# Patient Record
Sex: Male | Born: 1975 | Race: White | Hispanic: No | Marital: Single
Health system: Southern US, Community
[De-identification: ages and names within clinical notes are randomized; demographics above are authoritative.]

## PROBLEM LIST (undated history)

## (undated) DIAGNOSIS — F32A Depression, unspecified: Secondary | ICD-10-CM

## (undated) DIAGNOSIS — F1990 Other psychoactive substance use, unspecified, uncomplicated: Secondary | ICD-10-CM

## (undated) DIAGNOSIS — I639 Cerebral infarction, unspecified: Secondary | ICD-10-CM

## (undated) DIAGNOSIS — B1920 Unspecified viral hepatitis C without hepatic coma: Secondary | ICD-10-CM

## (undated) DIAGNOSIS — I2510 Atherosclerotic heart disease of native coronary artery without angina pectoris: Secondary | ICD-10-CM

## (undated) DIAGNOSIS — F329 Major depressive disorder, single episode, unspecified: Secondary | ICD-10-CM

## (undated) HISTORY — PX: HERNIA REPAIR: SHX51

---

## 2003-03-08 ENCOUNTER — Other Ambulatory Visit: Payer: Self-pay

## 2004-08-20 ENCOUNTER — Emergency Department: Payer: Self-pay | Admitting: Unknown Physician Specialty

## 2007-04-02 ENCOUNTER — Emergency Department: Payer: Self-pay | Admitting: Internal Medicine

## 2008-02-08 ENCOUNTER — Emergency Department: Payer: Self-pay | Admitting: Emergency Medicine

## 2008-08-24 ENCOUNTER — Emergency Department: Payer: Self-pay | Admitting: Internal Medicine

## 2009-08-22 ENCOUNTER — Emergency Department: Payer: Self-pay | Admitting: Emergency Medicine

## 2009-09-01 ENCOUNTER — Emergency Department: Payer: Self-pay | Admitting: Emergency Medicine

## 2009-09-06 ENCOUNTER — Emergency Department: Payer: Self-pay | Admitting: Emergency Medicine

## 2010-04-08 ENCOUNTER — Emergency Department: Payer: Self-pay | Admitting: Emergency Medicine

## 2011-01-05 ENCOUNTER — Ambulatory Visit: Payer: Self-pay | Admitting: Pain Medicine

## 2011-03-02 ENCOUNTER — Emergency Department: Payer: Self-pay | Admitting: Emergency Medicine

## 2011-10-24 ENCOUNTER — Ambulatory Visit: Payer: Self-pay | Admitting: Family Medicine

## 2011-12-09 ENCOUNTER — Emergency Department: Payer: Self-pay | Admitting: Emergency Medicine

## 2011-12-09 LAB — ETHANOL
Ethanol %: <0.003 % (ref 0.000–0.080)
Ethanol: <3 mg/dL

## 2011-12-09 LAB — CBC
HCT: 40.6 % (ref 40.0–52.0)
HGB: 14 g/dL (ref 13.0–18.0)
MCV: 90 fL (ref 80–100)
Platelet: 156 10*3/uL (ref 150–440)
RBC: 4.53 10*6/uL (ref 4.40–5.90)
RDW: 13.2 % (ref 11.5–14.5)
WBC: 7.5 10*3/uL (ref 3.8–10.6)

## 2011-12-09 LAB — COMPREHENSIVE METABOLIC PANEL
Albumin: 3.8 g/dL (ref 3.4–5.0)
Alkaline Phosphatase: 83 U/L (ref 50–136)
BUN: 15 mg/dL (ref 7–18)
Bilirubin,Total: 0.3 mg/dL (ref 0.2–1.0)
Calcium, Total: 8.6 mg/dL (ref 8.5–10.1)
Co2: 27 mmol/L (ref 21–32)
Creatinine: 1.01 mg/dL (ref 0.60–1.30)
EGFR (Non-African Amer.): >60
Osmolality: 287 (ref 275–301)
Potassium: 3.8 mmol/L (ref 3.5–5.1)
SGPT (ALT): 18 U/L (ref 12–78)
Sodium: 143 mmol/L (ref 136–145)
Total Protein: 6.6 g/dL (ref 6.4–8.2)

## 2011-12-09 LAB — DRUG SCREEN, URINE
Amphetamines, Ur Screen: NEGATIVE (ref ?–1000)
Benzodiazepine, Ur Scrn: POSITIVE (ref ?–200)
Cannabinoid 50 Ng, Ur ~~LOC~~: POSITIVE (ref ?–50)
Cocaine Metabolite,Ur ~~LOC~~: POSITIVE (ref ?–300)
MDMA (Ecstasy)Ur Screen: NEGATIVE (ref ?–500)
Methadone, Ur Screen: NEGATIVE (ref ?–300)
Tricyclic, Ur Screen: NEGATIVE (ref ?–1000)

## 2011-12-09 LAB — URINALYSIS, COMPLETE
Bacteria: NONE SEEN
Bilirubin,UR: NEGATIVE
Blood: NEGATIVE
Ketone: NEGATIVE
Leukocyte Esterase: NEGATIVE
Ph: 6 (ref 4.5–8.0)
RBC,UR: <1 /HPF (ref 0–5)
Specific Gravity: 1.01 (ref 1.003–1.030)
Squamous Epithelial: NONE SEEN
WBC UR: <1 /HPF (ref 0–5)

## 2011-12-09 LAB — SALICYLATE LEVEL: Salicylates, Serum: 2.7 mg/dL

## 2011-12-09 LAB — ACETAMINOPHEN LEVEL: Acetaminophen: <2 ug/mL

## 2012-03-03 ENCOUNTER — Emergency Department: Payer: Self-pay | Admitting: Emergency Medicine

## 2012-03-03 LAB — COMPREHENSIVE METABOLIC PANEL
Albumin: 3.3 g/dL — ABNORMAL LOW (ref 3.4–5.0)
Alkaline Phosphatase: 117 U/L (ref 50–136)
Anion Gap: 8 (ref 7–16)
Calcium, Total: 8.6 mg/dL (ref 8.5–10.1)
Chloride: 105 mmol/L (ref 98–107)
EGFR (Non-African Amer.): >60
Glucose: 139 mg/dL — ABNORMAL HIGH (ref 65–99)
Osmolality: 282 (ref 275–301)
Potassium: 3.1 mmol/L — ABNORMAL LOW (ref 3.5–5.1)
SGPT (ALT): 26 U/L (ref 12–78)
Sodium: 141 mmol/L (ref 136–145)
Total Protein: 6.9 g/dL (ref 6.4–8.2)

## 2012-03-03 LAB — TROPONIN I: Troponin-I: <0.02 ng/mL

## 2012-03-03 LAB — CBC
HCT: 40.8 % (ref 40.0–52.0)
MCH: 31.1 pg (ref 26.0–34.0)
MCHC: 34.3 g/dL (ref 32.0–36.0)
MCV: 91 fL (ref 80–100)
RDW: 13 % (ref 11.5–14.5)

## 2012-03-03 LAB — CK TOTAL AND CKMB (NOT AT ARMC): CK, Total: 117 U/L (ref 35–232)

## 2012-05-09 ENCOUNTER — Inpatient Hospital Stay: Payer: Self-pay | Admitting: Psychiatry

## 2012-05-09 LAB — URINALYSIS, COMPLETE
Bilirubin,UR: NEGATIVE
Blood: NEGATIVE
Glucose,UR: NEGATIVE mg/dL (ref 0–75)
Ketone: NEGATIVE
Leukocyte Esterase: NEGATIVE
Protein: NEGATIVE
RBC,UR: 2 /HPF (ref 0–5)
Specific Gravity: 1.026 (ref 1.003–1.030)

## 2012-05-09 LAB — CBC
HCT: 50.7 % (ref 40.0–52.0)
HGB: 17.2 g/dL (ref 13.0–18.0)
MCH: 30.2 pg (ref 26.0–34.0)
MCHC: 33.8 g/dL (ref 32.0–36.0)
RDW: 13.1 % (ref 11.5–14.5)
WBC: 14.1 10*3/uL — ABNORMAL HIGH (ref 3.8–10.6)

## 2012-05-09 LAB — DRUG SCREEN, URINE
Amphetamines, Ur Screen: NEGATIVE (ref ?–1000)
Cannabinoid 50 Ng, Ur ~~LOC~~: POSITIVE (ref ?–50)
Cocaine Metabolite,Ur ~~LOC~~: NEGATIVE (ref ?–300)
Methadone, Ur Screen: NEGATIVE (ref ?–300)
Tricyclic, Ur Screen: NEGATIVE (ref ?–1000)

## 2012-05-09 LAB — COMPREHENSIVE METABOLIC PANEL
Albumin: 4.5 g/dL (ref 3.4–5.0)
Calcium, Total: 8.6 mg/dL (ref 8.5–10.1)
Chloride: 112 mmol/L — ABNORMAL HIGH (ref 98–107)
Co2: 28 mmol/L (ref 21–32)
EGFR (African American): >60
SGPT (ALT): 26 U/L (ref 12–78)
Sodium: 142 mmol/L (ref 136–145)
Total Protein: 8.2 g/dL (ref 6.4–8.2)

## 2012-05-09 LAB — ACETAMINOPHEN LEVEL: Acetaminophen: <2 ug/mL

## 2012-05-09 LAB — TSH: Thyroid Stimulating Horm: 3.88 u[IU]/mL

## 2012-05-09 LAB — ETHANOL: Ethanol %: <0.003 % (ref 0.000–0.080)

## 2012-05-10 LAB — BEHAVIORAL MEDICINE 1 PANEL
Albumin: 3.6 g/dL (ref 3.4–5.0)
Alkaline Phosphatase: 83 U/L (ref 50–136)
Anion Gap: 6 — ABNORMAL LOW (ref 7–16)
Basophil #: 0.1 10*3/uL (ref 0.0–0.1)
Basophil %: 0.8 %
Calcium, Total: 8.3 mg/dL — ABNORMAL LOW (ref 8.5–10.1)
Co2: 27 mmol/L (ref 21–32)
Glucose: 87 mg/dL (ref 65–99)
HCT: 45.8 % (ref 40.0–52.0)
Lymphocyte #: 3.4 10*3/uL (ref 1.0–3.6)
MCHC: 31.6 g/dL — ABNORMAL LOW (ref 32.0–36.0)
MCV: 90 fL (ref 80–100)
Monocyte %: 4.9 %
Neutrophil #: 3 10*3/uL (ref 1.4–6.5)
Neutrophil %: 41.9 %
Osmolality: 279 (ref 275–301)
Platelet: 173 10*3/uL (ref 150–440)
RBC: 5.11 10*6/uL (ref 4.40–5.90)
RDW: 13.4 % (ref 11.5–14.5)
Sodium: 140 mmol/L (ref 136–145)
Thyroid Stimulating Horm: 0.378 u[IU]/mL — ABNORMAL LOW

## 2012-05-11 LAB — URINALYSIS, COMPLETE
Bilirubin,UR: NEGATIVE
Leukocyte Esterase: NEGATIVE
Nitrite: NEGATIVE
Protein: NEGATIVE
Specific Gravity: 1.006 (ref 1.003–1.030)
Squamous Epithelial: NONE SEEN

## 2012-09-30 ENCOUNTER — Inpatient Hospital Stay: Payer: Self-pay | Admitting: Internal Medicine

## 2012-09-30 LAB — BASIC METABOLIC PANEL
Anion Gap: 4 — ABNORMAL LOW (ref 7–16)
BUN: 8 mg/dL (ref 7–18)
Co2: 31 mmol/L (ref 21–32)
EGFR (African American): >60
EGFR (Non-African Amer.): >60
Glucose: 131 mg/dL — ABNORMAL HIGH (ref 65–99)
Potassium: 3.8 mmol/L (ref 3.5–5.1)

## 2012-09-30 LAB — CBC WITH DIFFERENTIAL/PLATELET
Basophil #: 0.1 10*3/uL (ref 0.0–0.1)
Basophil %: 0.5 %
Eosinophil #: 0 10*3/uL (ref 0.0–0.7)
HCT: 44.8 % (ref 40.0–52.0)
MCH: 28.7 pg (ref 26.0–34.0)
MCHC: 33.2 g/dL (ref 32.0–36.0)
MCV: 87 fL (ref 80–100)
Monocyte #: 0.9 x10 3/mm (ref 0.2–1.0)
Monocyte %: 7.5 %
Neutrophil %: 75.2 %
RBC: 5.17 10*6/uL (ref 4.40–5.90)
RDW: 13.2 % (ref 11.5–14.5)
WBC: 12.1 10*3/uL — ABNORMAL HIGH (ref 3.8–10.6)

## 2012-10-01 LAB — TSH: Thyroid Stimulating Horm: 0.392 u[IU]/mL — ABNORMAL LOW

## 2012-10-01 LAB — CBC WITH DIFFERENTIAL/PLATELET
Basophil #: 0.1 10*3/uL (ref 0.0–0.1)
HCT: 42.8 % (ref 40.0–52.0)
Lymphocyte #: 2.5 10*3/uL (ref 1.0–3.6)
Lymphocyte %: 20.3 %
MCH: 29.9 pg (ref 26.0–34.0)
MCV: 86 fL (ref 80–100)
Monocyte %: 7.5 %
Neutrophil %: 70.3 %
Platelet: 139 10*3/uL — ABNORMAL LOW (ref 150–440)
RBC: 4.96 10*6/uL (ref 4.40–5.90)
RDW: 13.5 % (ref 11.5–14.5)
WBC: 12.2 10*3/uL — ABNORMAL HIGH (ref 3.8–10.6)

## 2012-10-01 LAB — BASIC METABOLIC PANEL
Anion Gap: 5 — ABNORMAL LOW (ref 7–16)
BUN: 7 mg/dL (ref 7–18)
Chloride: 106 mmol/L (ref 98–107)
Co2: 27 mmol/L (ref 21–32)
Creatinine: 1.21 mg/dL (ref 0.60–1.30)
EGFR (African American): >60
Glucose: 138 mg/dL — ABNORMAL HIGH (ref 65–99)
Osmolality: 276 (ref 275–301)
Potassium: 3.3 mmol/L — ABNORMAL LOW (ref 3.5–5.1)
Sodium: 138 mmol/L (ref 136–145)

## 2012-10-01 LAB — MAGNESIUM: Magnesium: 1.9 mg/dL

## 2012-10-02 LAB — CREATININE, SERUM
Creatinine: 4.88 mg/dL — ABNORMAL HIGH (ref 0.60–1.30)
EGFR (African American): 16 — ABNORMAL LOW

## 2012-10-03 LAB — PROTEIN / CREATININE RATIO, URINE
Creatinine, Urine: 50.3 mg/dL (ref 30.0–125.0)
Protein, Random Urine: 35 mg/dL — ABNORMAL HIGH (ref 0–12)
Protein/Creat. Ratio: 696 mg/gCREAT — ABNORMAL HIGH (ref 0–200)

## 2012-10-03 LAB — CBC WITH DIFFERENTIAL/PLATELET
Basophil #: 0.1 10*3/uL (ref 0.0–0.1)
Eosinophil #: 0.2 10*3/uL (ref 0.0–0.7)
Eosinophil %: 2.6 %
Lymphocyte #: 1.8 10*3/uL (ref 1.0–3.6)
Lymphocyte %: 20.1 %
MCH: 30.3 pg (ref 26.0–34.0)
MCHC: 34.8 g/dL (ref 32.0–36.0)
MCV: 87 fL (ref 80–100)
Monocyte #: 0.8 x10 3/mm (ref 0.2–1.0)
Neutrophil #: 6.2 10*3/uL (ref 1.4–6.5)
WBC: 9.1 10*3/uL (ref 3.8–10.6)

## 2012-10-03 LAB — URINALYSIS, COMPLETE
Glucose,UR: NEGATIVE mg/dL (ref 0–75)
Ketone: NEGATIVE
Nitrite: NEGATIVE
Ph: 5 (ref 4.5–8.0)
RBC,UR: 1 /HPF (ref 0–5)
Specific Gravity: 1.009 (ref 1.003–1.030)
Squamous Epithelial: NONE SEEN

## 2012-10-03 LAB — BASIC METABOLIC PANEL
Anion Gap: 6 — ABNORMAL LOW (ref 7–16)
BUN: 26 mg/dL — ABNORMAL HIGH (ref 7–18)
Chloride: 107 mmol/L (ref 98–107)
Co2: 24 mmol/L (ref 21–32)
Creatinine: 7.21 mg/dL — ABNORMAL HIGH (ref 0.60–1.30)
EGFR (African American): 10 — ABNORMAL LOW
EGFR (Non-African Amer.): 9 — ABNORMAL LOW
Glucose: 121 mg/dL — ABNORMAL HIGH (ref 65–99)
Potassium: 5 mmol/L (ref 3.5–5.1)

## 2012-10-04 LAB — BASIC METABOLIC PANEL
Anion Gap: 7 (ref 7–16)
BUN: 29 mg/dL — ABNORMAL HIGH (ref 7–18)
Calcium, Total: 8 mg/dL — ABNORMAL LOW (ref 8.5–10.1)
Chloride: 110 mmol/L — ABNORMAL HIGH (ref 98–107)
Co2: 22 mmol/L (ref 21–32)
EGFR (African American): 8 — ABNORMAL LOW
EGFR (Non-African Amer.): 7 — ABNORMAL LOW
Glucose: 112 mg/dL — ABNORMAL HIGH (ref 65–99)
Osmolality: 284 (ref 275–301)
Potassium: 4.7 mmol/L (ref 3.5–5.1)
Sodium: 139 mmol/L (ref 136–145)

## 2012-10-05 LAB — COMPREHENSIVE METABOLIC PANEL
Albumin: 1.9 g/dL — ABNORMAL LOW (ref 3.4–5.0)
Alkaline Phosphatase: 157 U/L — ABNORMAL HIGH (ref 50–136)
Anion Gap: 9 (ref 7–16)
BUN: 33 mg/dL — ABNORMAL HIGH (ref 7–18)
Bilirubin,Total: 0.4 mg/dL (ref 0.2–1.0)
Calcium, Total: 7.8 mg/dL — ABNORMAL LOW (ref 8.5–10.1)
Creatinine: 9.16 mg/dL — ABNORMAL HIGH (ref 0.60–1.30)
EGFR (African American): 8 — ABNORMAL LOW
Glucose: 123 mg/dL — ABNORMAL HIGH (ref 65–99)
SGOT(AST): 52 U/L — ABNORMAL HIGH (ref 15–37)
SGPT (ALT): 69 U/L (ref 12–78)
Total Protein: 5.2 g/dL — ABNORMAL LOW (ref 6.4–8.2)

## 2012-10-05 LAB — CBC WITH DIFFERENTIAL/PLATELET
Basophil %: 1 %
Eosinophil #: 0.3 10*3/uL (ref 0.0–0.7)
HCT: 38.6 % — ABNORMAL LOW (ref 40.0–52.0)
HGB: 13.6 g/dL (ref 13.0–18.0)
Lymphocyte #: 2.3 10*3/uL (ref 1.0–3.6)
Lymphocyte %: 28.8 %
MCH: 30.2 pg (ref 26.0–34.0)
MCV: 86 fL (ref 80–100)
Monocyte %: 8.7 %
RBC: 4.49 10*6/uL (ref 4.40–5.90)
RDW: 13.6 % (ref 11.5–14.5)
WBC: 8 10*3/uL (ref 3.8–10.6)

## 2012-10-05 LAB — PHOSPHORUS: Phosphorus: 4.8 mg/dL (ref 2.5–4.9)

## 2012-10-05 LAB — CULTURE, BLOOD (SINGLE)

## 2012-10-06 LAB — BASIC METABOLIC PANEL
Anion Gap: 11 (ref 7–16)
BUN: 35 mg/dL — ABNORMAL HIGH (ref 7–18)
Calcium, Total: 8.1 mg/dL — ABNORMAL LOW (ref 8.5–10.1)
Chloride: 108 mmol/L — ABNORMAL HIGH (ref 98–107)
Co2: 21 mmol/L (ref 21–32)
Creatinine: 9.81 mg/dL — ABNORMAL HIGH (ref 0.60–1.30)
EGFR (African American): 7 — ABNORMAL LOW
Glucose: 99 mg/dL (ref 65–99)
Potassium: 4.8 mmol/L (ref 3.5–5.1)

## 2012-10-07 LAB — BASIC METABOLIC PANEL
BUN: 38 mg/dL — ABNORMAL HIGH (ref 7–18)
Calcium, Total: 8.2 mg/dL — ABNORMAL LOW (ref 8.5–10.1)
Chloride: 109 mmol/L — ABNORMAL HIGH (ref 98–107)
Co2: 22 mmol/L (ref 21–32)
EGFR (African American): 8 — ABNORMAL LOW
Glucose: 121 mg/dL — ABNORMAL HIGH (ref 65–99)
Osmolality: 290 (ref 275–301)
Potassium: 4.8 mmol/L (ref 3.5–5.1)

## 2012-10-07 LAB — CBC WITH DIFFERENTIAL/PLATELET
Basophil #: 0.1 10*3/uL (ref 0.0–0.1)
Eosinophil #: 0.3 10*3/uL (ref 0.0–0.7)
Lymphocyte #: 2.5 10*3/uL (ref 1.0–3.6)
Monocyte #: 0.6 x10 3/mm (ref 0.2–1.0)
Neutrophil #: 6.4 10*3/uL (ref 1.4–6.5)
Neutrophil %: 64.9 %
Platelet: 177 10*3/uL (ref 150–440)
RBC: 4.45 10*6/uL (ref 4.40–5.90)
RDW: 13.7 % (ref 11.5–14.5)

## 2012-10-08 LAB — BASIC METABOLIC PANEL
Anion Gap: 5 — ABNORMAL LOW (ref 7–16)
BUN: 39 mg/dL — ABNORMAL HIGH (ref 7–18)
Glucose: 102 mg/dL — ABNORMAL HIGH (ref 65–99)
Osmolality: 289 (ref 275–301)
Sodium: 140 mmol/L (ref 136–145)

## 2012-10-09 LAB — BASIC METABOLIC PANEL
Anion Gap: 5 — ABNORMAL LOW (ref 7–16)
Chloride: 111 mmol/L — ABNORMAL HIGH (ref 98–107)
Co2: 25 mmol/L (ref 21–32)
Creatinine: 7.46 mg/dL — ABNORMAL HIGH (ref 0.60–1.30)
EGFR (African American): 10 — ABNORMAL LOW
EGFR (Non-African Amer.): 8 — ABNORMAL LOW
Glucose: 104 mg/dL — ABNORMAL HIGH (ref 65–99)

## 2012-10-10 LAB — BASIC METABOLIC PANEL
Calcium, Total: 8.3 mg/dL — ABNORMAL LOW (ref 8.5–10.1)
Chloride: 111 mmol/L — ABNORMAL HIGH (ref 98–107)
Co2: 23 mmol/L (ref 21–32)
Creatinine: 6.05 mg/dL — ABNORMAL HIGH (ref 0.60–1.30)
EGFR (African American): 13 — ABNORMAL LOW
EGFR (Non-African Amer.): 11 — ABNORMAL LOW
Glucose: 95 mg/dL (ref 65–99)
Potassium: 4.8 mmol/L (ref 3.5–5.1)
Sodium: 142 mmol/L (ref 136–145)

## 2012-10-11 LAB — BASIC METABOLIC PANEL
Calcium, Total: 8.6 mg/dL (ref 8.5–10.1)
Chloride: 113 mmol/L — ABNORMAL HIGH (ref 98–107)
Co2: 24 mmol/L (ref 21–32)
EGFR (Non-African Amer.): 14 — ABNORMAL LOW
Osmolality: 291 (ref 275–301)
Potassium: 4.8 mmol/L (ref 3.5–5.1)
Sodium: 142 mmol/L (ref 136–145)

## 2012-10-11 LAB — PLATELET COUNT: Platelet: 148 10*3/uL — ABNORMAL LOW (ref 150–440)

## 2012-10-12 LAB — BASIC METABOLIC PANEL
Anion Gap: 7 (ref 7–16)
BUN: 43 mg/dL — ABNORMAL HIGH (ref 7–18)
Chloride: 108 mmol/L — ABNORMAL HIGH (ref 98–107)
Co2: 24 mmol/L (ref 21–32)
Creatinine: 4.13 mg/dL — ABNORMAL HIGH (ref 0.60–1.30)
EGFR (Non-African Amer.): 17 — ABNORMAL LOW
Glucose: 119 mg/dL — ABNORMAL HIGH (ref 65–99)

## 2013-05-04 ENCOUNTER — Emergency Department: Payer: Self-pay | Admitting: Emergency Medicine

## 2013-05-05 LAB — COMPREHENSIVE METABOLIC PANEL
ALBUMIN: 2.8 g/dL — AB (ref 3.4–5.0)
ANION GAP: 3 — AB (ref 7–16)
Alkaline Phosphatase: 107 U/L
BUN: 9 mg/dL (ref 7–18)
Bilirubin,Total: 0.3 mg/dL (ref 0.2–1.0)
CALCIUM: 7.7 mg/dL — AB (ref 8.5–10.1)
Chloride: 105 mmol/L (ref 98–107)
Co2: 32 mmol/L (ref 21–32)
Creatinine: 1.03 mg/dL (ref 0.60–1.30)
EGFR (African American): >60
EGFR (Non-African Amer.): >60
Glucose: 115 mg/dL — ABNORMAL HIGH (ref 65–99)
Osmolality: 279 (ref 275–301)
POTASSIUM: 3.7 mmol/L (ref 3.5–5.1)
SGOT(AST): 103 U/L — ABNORMAL HIGH (ref 15–37)
SGPT (ALT): 132 U/L — ABNORMAL HIGH (ref 12–78)
Sodium: 140 mmol/L (ref 136–145)
TOTAL PROTEIN: 6.1 g/dL — AB (ref 6.4–8.2)

## 2013-05-05 LAB — CBC
HCT: 40.3 % (ref 40.0–52.0)
HGB: 13.5 g/dL (ref 13.0–18.0)
MCH: 30.2 pg (ref 26.0–34.0)
MCHC: 33.5 g/dL (ref 32.0–36.0)
MCV: 90 fL (ref 80–100)
Platelet: 152 10*3/uL (ref 150–440)
RBC: 4.47 10*6/uL (ref 4.40–5.90)
RDW: 12.8 % (ref 11.5–14.5)
WBC: 6.3 10*3/uL (ref 3.8–10.6)

## 2013-05-05 LAB — PRO B NATRIURETIC PEPTIDE: B-Type Natriuretic Peptide: 91 pg/mL (ref 0–125)

## 2013-10-03 IMAGING — XA IR VASCULAR PROCEDURE
1 series · 1 of 1 positions shown · non-contrast
Comparison: none

[Series 1: single · 1 of 1 slices shown]
[im 1/1]
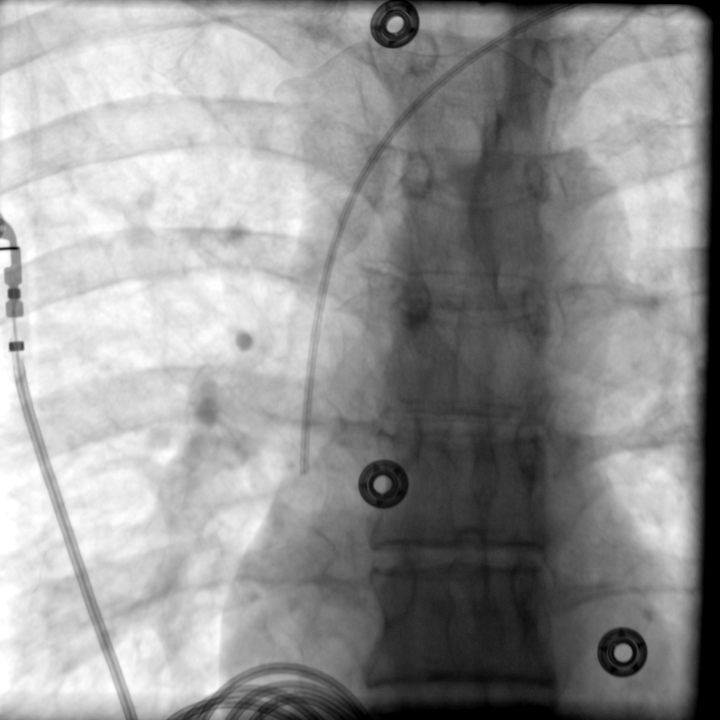

[1 of 1 positions shown; findings below may reference images not displayed]

IMAGES IMPORTED FROM THE [employer] WORKFLOW SYSTEM
NO DICTATION FOR STUDY

## 2013-10-08 IMAGING — CR DG ABDOMEN 3V
1 series · 4 of 4 positions shown · non-contrast
Comparison: none

REASON FOR EXAM: ABDOMINAL PAIN.
COMMENTS:   May transport without cardiac monitor

PROCEDURE:     DXR - DXR ABDOMEN 3-WAY (INCL PA CXR)  - October 06, 2012  [DATE]
RESULT:

[Series 3: w chest pa · 0.14mm/px · 4 of 4 slices shown]
[im 1/4]
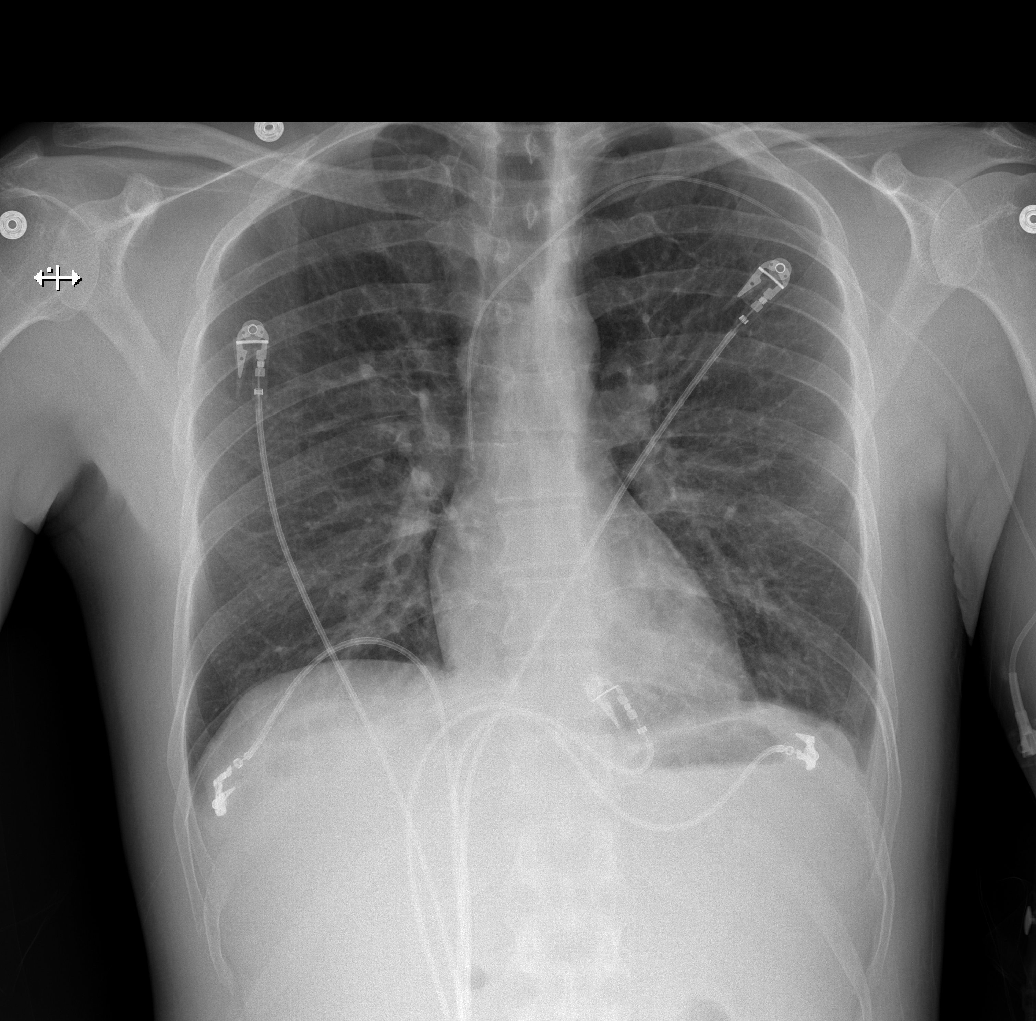
[im 2/4]
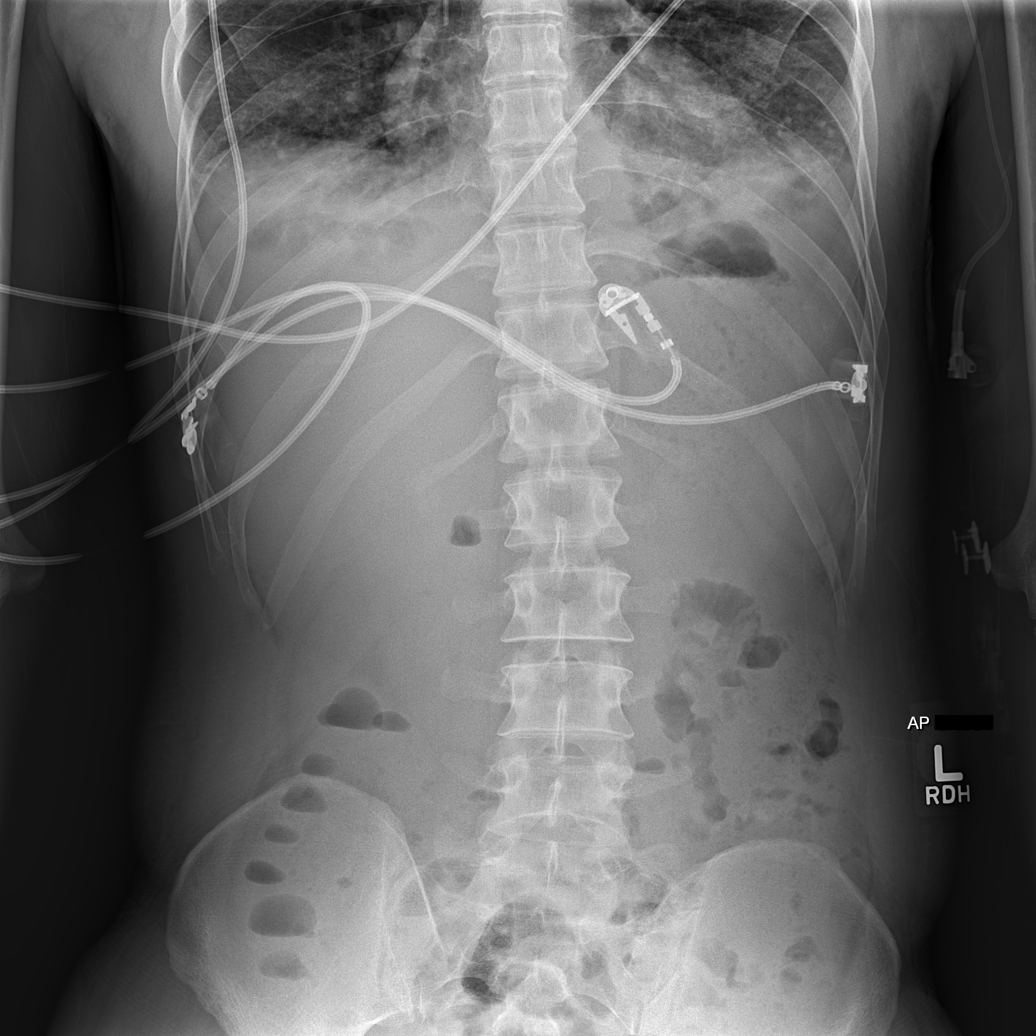
[im 3/4]
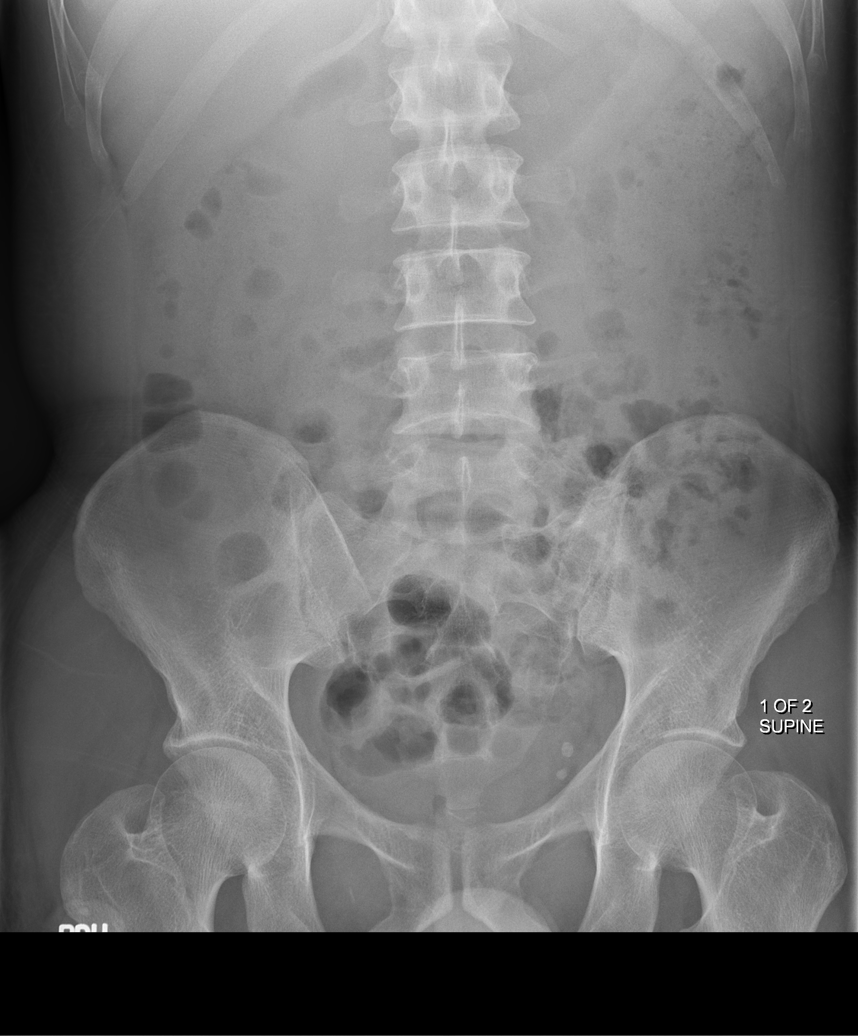
[im 4/4]
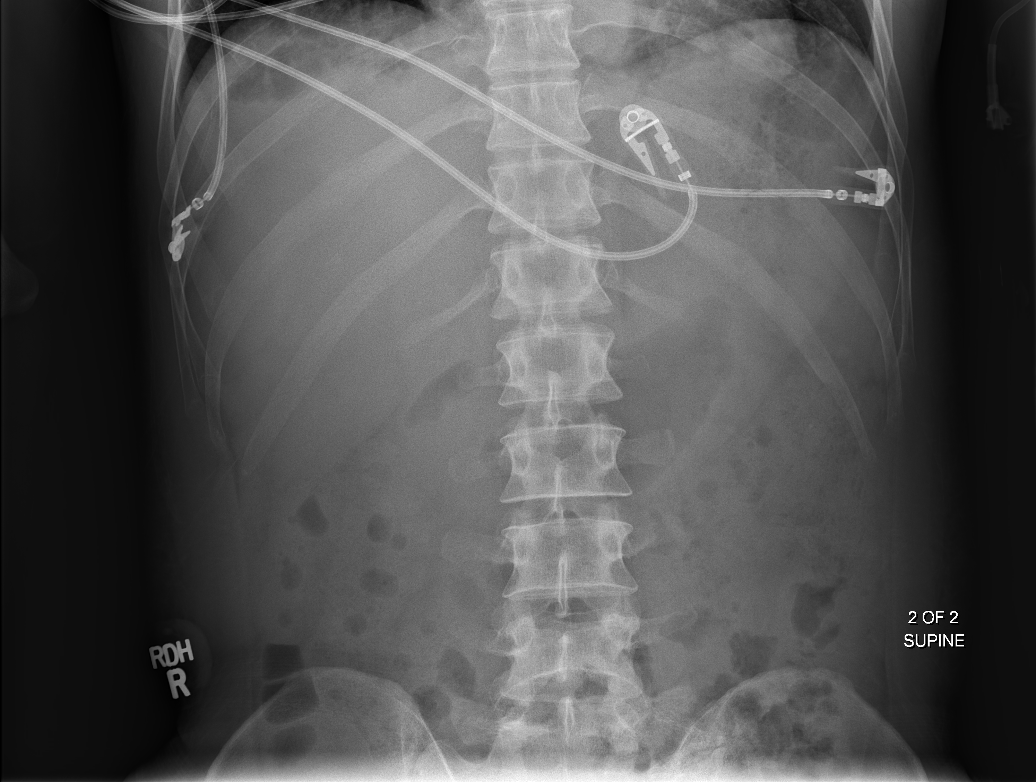

[4 of 4 positions shown; findings below may reference images not displayed]

FINDINGS: Frontal view of the chest demonstrates no evidence of focal
infiltrates, effusions or edema. A left sided central venous catheter is
appreciated with the tip projecting in the region of the superior vena cava.

Air is seen within nondilated loops of large and small bowel. There is a
paucity of distal bowel gas. The visualized bony skeleton demonstrates no
evidence of fracture or dislocation. There is no evidence of free air.
IMPRESSION: 1.  Nonspecific, nonobstructive bowel gas pattern. Surveillance evaluation
is recommended, if and as clinically warranted.
2.  Chest radiograph without evidence of acute cardiopulmonary disease.

## 2014-06-27 NOTE — Consult Note (Signed)
PATIENT NAME:  Roy Gordon, Jaylon P MR#:  [id number] DATE OF BIRTH:  1975/12/07  DATE OF CONSULTATION:  09/30/2012  ATTENDING PHYSICIAN: Cristal Deerhristopher A. Edia Pursifull, MD   REASON FOR CONSULTATION: Right upper arm swelling, erythema, edema, pain, leukocytosis.   HISTORY OF PRESENT ILLNESS: Mr. Roy Gordon is a pleasant 39 year old male with history of chronic lower back pain, depression with previous suicide attempts and a history of IV drug use who presented to the ED with a 2-day history of right upper extremity redness and swelling. He says that he injects heroin, did inject heroin in that area, and noticed that it became red 2 days ago.  It has gotten worse and has begun to swell and is extremely painful. Since he has been here, he has been on IV antibiotics, and he feels that it has improved from the standpoint of its redness as well as its pain. He has been given IV vancomycin Zosyn since he has been here.  He was febrile to 102 at admission. No chills, night sweats, shortness of breath, cough, chest pain, abdominal pain, nausea, vomiting, diarrhea, constipation, dysuria or hematuria.   PAST MEDICAL HISTORY: Chronic lower back pain, depression, IV drug use.   PAST SURGICAL HISTORY: Hernia repair.   ALLERGIES: No known drug allergies.   MEDICATIONS: Not on currently any prescription home medications.   FAMILY HISTORY: Denies any history of diabetes, high blood pressure, heart problems, cancers in family.   SOCIAL HISTORY: [place]  Smokes 1-1/2 packs a day. Denies alcohol. Does inject IV heroin.   REVIEW OF SYSTEMS: A 12-point review was obtained. Pertinent positives and negatives as above.   PHYSICAL EXAMINATION: VITAL SIGNS: Temperature 100.7, pulse 97, blood pressure 124/79, respirations 20, 98% on room air.  GENERAL: No acute distress. Alert and oriented x 3.  HEENT: Head: Normocephalic, atraumatic. Eyes: No scleral icterus. No conjunctivitis. Face: No obvious facial trauma. Normal  external nose. Normal external ears. Eyes: No scleral icterus. No conjunctivitis.  CHEST: Lungs clear to auscultation. Moving air well. HEART: Regular rate and rhythm.  No murmurs, rubs or gallops.  ABDOMEN: Soft, nontender, nondistended.  NEUROLOGICAL: Cranial nerves II through XII grossly intact. Sensation intact in all 4 extremities.  EXTREMITIES: Has a small area of induration on the medial arm with tenderness to palpation, is able to move arm with some discomfort. Able to move the fingers easily. No obvious fluctuance.   LABORATORY DATA: White cell count of 12.1, hemoglobin 14.9 , hematocrit 44.8, platelets 147. Blood cultures negative. CMP is negative.   ASSESSMENT AND PLAN: Mr. Roy Gordon is a pleasant 39 year old with cellulitis, questionable abscess secondary to IV drug injection in the right medial upper arm.  I am uncertain whether there is purulence; however, I suspect that there may be. I have offered him drainage and/or further imaging to determine whether there is an abscess present and drainage. He does not want to undergo surgical drainage at this time. He feels that it is getting better, and I feel no need for further imaging if we will not act upon it. We will continue to follow.   ____________________________ Si Raiderhristopher A. Daylin Eads, MD cal:cb D: 09/30/2012 15:54:55 ET T: 09/30/2012 16:25:21 ET JOB#: [id number]  cc: Cristal Deerhristopher A. Ramesh Moan, MD, <Dictator> Jarvis NewcomerHRISTOPHER A Kenyette Gundy MD ELECTRONICALLY SIGNED 10/08/2012 20:38

## 2014-06-27 NOTE — Consult Note (Signed)
Details:   - Psychiatry: Followup for this patient with opiate dependence. He continues to complain of abdominal pain but says that it is not as bad as it was yesterday. He is not having the hiccuping feeling as often. He appreciates having a larger dose of opiates. Patient is not reporting symptoms of opiate withdrawal. He is not reporting flulike symptoms of or her muscle aches and pains. He is not having nausea vomiting or diarrhea. I don't think that what he is having right now is probably opiate withdrawal. I continue to think that management should just begun to target his specific pain right now. I do think it seems like the Haldol dose at night was a little bit helpful. I will repeat that and continue the Bentyl and the other pain medicine as ordered. No change treatment plan. Supportive therapy done around his continued sickness. Will continue to follow as needed.   Electronic Signatures: Audery Amellapacs, Jamarr Treinen T (MD)  (Signed 03-Aug-14 19:29)  Authored: Details   Last Updated: 03-Aug-14 19:29 by Audery Amellapacs, Cassidy Tashiro T (MD)
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
## 2014-06-27 NOTE — Discharge Summary (Signed)
PATIENT NAME:  Myriam ForehandSABOT, Torrin P MR#:  [id number] DATE OF BIRTH:  1975-09-10  DATE OF ADMISSION:  09/30/2012 DATE OF DISCHARGE:  10/12/2012  DISCHARGE DIAGNOSES: 1.  Right arm cellulitis, treated.  2.  Acute renal failure with a creatinine of 4.1 now with continued improvement, likely due to acute tubular necrosis from medications, possibly vancomycin, heroin and nonsteroidal anti-inflammatory.   SECONDARY DIAGNOSES: 1.  Chronic low back pain.  2.  Depression.  3.  History of IV drug abuse.   CONSULTATION: 1.  Nephrology, Dr. Thedore MinsSingh.  2.  Surgery , Dr. Egbert GaribaldiBird.  3.  Psychiatric, Dr. Toni Amendlapacs.   PROCEDURES/RADIOLOGY: Insertion of PICC line by Dr. Wyn Quakerew on the July 27. Right upper arm Doppler on July 28 showed no definite abscess. Nonocclusive thrombus in the distal right basilic vein.   Bilateral renal ultrasound on July 30 showed medical renal disease. No significant abnormality.   Chest x-ray on July 30 showed no acute cardiopulmonary disease. PICC line present.   Abdominal 3-way with PA of chest on August 2 showed nonspecific, nonobstructive bowel gas pattern.   Chest x-ray without any acute cardiopulmonary disease.   MAJOR LABORATORY PANEL: Blood cultures x 2 were negative the July 27.   Urinalysis on admission was negative.   HISTORY AND SHORT HOSPITAL COURSE: The patient is a 39 year old male with above-mentioned medical problems, who was admitted for right acute right upper extremity cellulitis with a history of IV heroin use, was started on IV vancomycin and Zosyn along with Toradol for pain control. Please see Dr. Rob HickmanGouru's dictated history and physical for further details. Surgical consultation was obtained with Dr. Egbert GaribaldiBird who recommended further imaging to determine whether there is any underlying abscess and need for I and D. The patient had significant improvement with IV antibiotics and did not require any incision and drainage. The patient started having acute renal failure while in  the hospital, thought to be secondary to acute tubular necrosis from medications, possibly vancomycin, non-steroidals and use of IV heroin. His antibiotics were switched to linezolid and nephrology consultation was obtained with Dr. Thedore MinsSingh who was following the patient very closely in the hospital stay, and the patient's renal function was slowly improving after stopping the medications. Psychiatry consultation was obtained with Dr. Toni Amendlapacs, as the patient having possible functional abdominal pain worrisome for narcotic withdrawal. Dr. Toni Amendlapacs also recommended using Bentyl for his stomach cramps, which was slowly improving. The patient's kidney function during the inpatient hospital stay continued slow improvement and at the base was improving with a good urine output.  It was felt that the patient could be discharged safely on the August 8 with his creatinine of 4.13 after discussion with nephrology.   On the date of discharge, his vital signs were as follows: Temperature 98.6, heart rate 70 per minute, respirations 18 per minute, blood pressure 133/74 mmHg, he was saturating 97% on room air.   PERTINENT PHYSICAL EXAMINATION ON THE DATE OF DISCHARGE:  CARDIOVASCULAR: S1, S2 normal. No murmurs or gallop.  LUNGS: Clear to auscultation bilaterally. No wheezing, rales, rhonchi or crepitation.  ABDOMEN: Soft, benign.  NEUROLOGIC: Nonfocal examination.  All other physical examination remained at baseline.   DISCHARGE MEDICATIONS: 1.  Dicyclomine 20 mg p.o. b.i.d.  2.  Amlodipine 10 mg p.o. daily.   DISCHARGE DIET: Renal diet.   DISCHARGE ACTIVITY: As tolerated.   DISCHARGE INSTRUCTIONS AND FOLLOW-UP: The patient was instructed to follow-up with his primary care physician, Dr. Lacie ScottsNiemeyer, in 2 to 4 weeks. He  was requested to follow-up with nephrology, Dr. Mady Haagensen in 1 to 2 weeks. He was also requested to check his basic metabolic panel on Monday August 11 with results forwarded to his primary care  physician, Dr. Lacie Scotts and Dr. Heide Spark for kidney function monitoring. He was also instructed to drink a lot of fluids,   TOTAL TIME DISCHARGING THIS PATIENT: 55 minutes.     ____________________________ Ellamae Sia. Sherryll Burger, MD vss:cc D: 10/14/2012 06:54:17 ET T: 10/14/2012 08:29:47 ET JOB#: [id number]  cc: Lori Popowski S. Sherryll Burger, MD, <Dictator> Meindert A. Lacie Scotts, MD [facility]. Egbert Garibaldi, MD Munsoor Lizabeth Leyden, MD Audery Amel, MD Ellamae Sia [facility] MD ELECTRONICALLY SIGNED 10/14/2012 16:02

## 2014-06-27 NOTE — H&P (Signed)
PATIENT NAME:  Roy Gordon, Roy Gordon MR#:  [id number] DATE OF BIRTH:  1975/12/27  DATE OF ADMISSION:  09/30/2012  PRIMARY CARE PHYSICIAN: Dr. Lacie Scotts.   REFERRING PHYSICIAN: Dr. Dolores Frame.   CHIEF COMPLAINT: Right elbow swelling and edema.   HISTORY OF PRESENT ILLNESS: The patient is a 39 year old Caucasian male with past medical history of chronic low back pain, depression with suicidal attempts in the past and history of IV drug abuse who is presenting to the ER with a 2-day history of right elbow redness and swelling. The patient admits that he injects heroin into right hand as well as right shoulder. He noticed some redness 2 days ago. It has progressively gotten worse by yesterday with worsening of the redness, swelling with excruciating pain. The patient comes to the ER. He was diagnosed with right upper extremity cellulitis in the elbow area and cultures were obtained in the ER. The patient has received first dose of IV Zosyn and vancomycin. Hospitalist team is called to admit the patient. The patient to denies any suicidal or homicidal ideation and denies any chest pain, nausea, vomiting, shortness of breath, abdominal pain or fever. No other complaints.   PAST MEDICAL HISTORY: Chronic low back pain, depression and history of IV drug abuse.   PAST SURGICAL HISTORY: Hernia repair.   ALLERGIES: No known drug allergies.  MEDICATIONS: Not on any home medications.   FAMILY HISTORY: Reviewed and denies any.  SOCIAL HISTORY: Smokes 1-1/2 packs a day. Denies alcohol. Admits injecting IV heroin.   REVIEW OF SYSTEMS: CONSTITUTIONAL: Denies fever, fatigue.  HEENT: Denies headache, blurry vision. Denies glaucoma. Denies tinnitus, epistaxis.  RESPIRATION: Denies cough, COPD.  CARDIOVASCULAR: No chest pain, palpitations.  GASTROINTESTINAL: Denies nausea, vomiting, diarrhea.  GENITOURINARY: No dysuria, hematuria. Status post hernia repair. ENDOCRINE: Denies polyuria, nocturia.  HEMATOLOGIC AND LYMPHATIC:  No anemia or easy bruising.  INTEGUMENTARY: No acne, rash, lesions.  MUSCULOSKELETAL: Chronic low back pain is present. Denies gout.  NEUROLOGIC: Denies of CVA, TIA,  PSYCHIATRIC: Chronic history of depression. Denies ADD, OCD.   PHYSICAL EXAMINATION: VITAL SIGNS: Temperature 99.9, pulse 106 initially, respirations 18, blood pressure 139/85, pulse oximetry 100% on room air.  GENERAL APPEARANCE: Uncomfortable from right elbow pain and swelling, otherwise moderately built and moderately nourished.  HEENT: Normocephalic, atraumatic. Pupils are equally reacting to light and accommodation. No scleral icterus. No sinus tenderness. No postnasal drip.  NECK: Supple. No JVD. No thyromegaly. No lymphadenopathy. Range of motion is intact.  LUNGS: Clear to auscultation bilaterally. No accessory muscle use and no anterior chest wall tenderness on palpation.  CARDIAC: S1, S2 normal. Regular rate and rhythm. No murmurs. No edema.  GASTROINTESTINAL: Soft. Bowel sounds are positive in all 4 quadrants. Nontender nondistended. No masses felt. No hepatosplenomegaly.  NEUROLOGIC: Awake, alert, oriented x3. Cranial nerves II through XII are grossly intact. Motor and sensory are intact. Reflexes are 2+.  EXTREMITIES: Lower extremities: No cyanosis, no clubbing, no edema. Right upper extremity: The elbow area is warm to touch, erythematous, tender with edema and fluctuance. No discharge noticed. Range of motion is limited in view of significant edema surrounding the elbow area. MUSCULOSKELETAL: No joint effusion, tenderness or erythema except in the right elbow area which is extremely tender, warm to touch and erythematous.   LABORATORY AND ADMITTING STUDIES: WBC 12.1, hemoglobin 14.9, hematocrit is 44.8, platelets 147. Glucose 181, BUN 8, creatinine 0.88, sodium 133, potassium 3.8, chloride 98, CO2 31. GFR greater than 60. Serum osmolality 267. Calcium 8.5.   ASSESSMENT  AND PLAN: A 39 year old Caucasian male presenting  to the Emergency Room with chief complaint of right elbow pain, swelling and redness, will be admitted with the following assessment and plan:  1. Acute right upper extremity cellulitis with history of IV heroin use in the right upper extremity area. Blood cultures were obtained in the ER with Zosyn and vancomycin started. We will continue that. Surgical consult is placed. We will provide him Toradol as needed basis for pain management.  2. Nicotine usage. Counseled patient to quit. He will think about it. We will put him on nicotine patch.  3. Lumbago, stable at this time. We will provide him p.r.n. pain medication.  4. Depression. Denies any suicidal or homicidal ideation.  5. Deep vein thrombosis prophylaxis is not needed as the patient is ambulatory. Gastrointestinal prophylaxis with ranitidine.   He is full code.   POWER OF ATTORNEY: His girlfriend, Ms. Roy Gordon.  Total time spent on admission, 45 minutes. Diagnosis and plan of care was discussed with the patient. He is aware of the plan.     ____________________________ Ramonita LabAruna Reesa Gotschall, MD ag:aw D: 09/30/2012 06:17:00 ET T: 09/30/2012 06:40:36 ET JOB#: [id number]  cc: Ramonita LabAruna Donshay Lupinski, MD, <Dictator> Ramonita LabARUNA Machele Deihl MD ELECTRONICALLY SIGNED 10/02/2012 5:59

## 2014-06-27 NOTE — Consult Note (Signed)
Brief Consult Note: Diagnosis: acute abdominal pain of unclear cause, opiate dependence.   Patient was seen by consultant.   Consult note dictated.   Recommend further assessment or treatment.   Orders entered.   Comments: Psychiatry: Patient seen. Chart reviewed. Orders done. Note dictated. PAtient was abusing heroin prior to admit and now is having medical pain that warrents narcotic treatment for pain. Abdominal pain unclear origin. Doesn't sound much like narcotic withdrawl.  Legally, one can't treat narcotic withdrawl symptoms with narcotics in the hospital unless the goal is detox and the treatment can only last three days. Since he is needing pain treatment, the goal currently is not detox. Therefore use of narcotics should be just what is needed to treat pain.  I have changed his PRN oxycontin to 10mg  q6 and gotten rid of the acetominophen so not stress his liver needlessly. I'm going to try Bentyl for stomach cramping to see if that helps. 20mg  but only q12. I will also try a one time bolus of iv Haldol 2mg  as he describes the pain as having a "hiccup" pattern to se if that breaks the cycle. Will follow this weekend.  Electronic Signatures: Audery Amellapacs, Siana Panameno T (MD)  (Signed 01-Aug-14 14:08)  Authored: Brief Consult Note   Last Updated: 01-Aug-14 14:08 by Audery Amellapacs, Itzae Mccurdy T (MD)

## 2014-06-27 NOTE — Consult Note (Signed)
Details:   - Psychiatry: Follow up patient with abdominal pain and hx opiate dependence. Tells me today his pain has eased off and is not as bad. Not having hiccup feeling as often. Mood a little more optimistic. Looks sedated but alert and oriented and interactive. No suicidal ideation reported. Suggest no change to current medication plan. Will follow as needed.   Electronic Signatures: Audery Amellapacs, John T (MD)  (Signed 03-Aug-14 15:41)  Authored: Details   Last Updated: 03-Aug-14 15:41 by Audery Amellapacs, John T (MD)

## 2014-06-27 NOTE — Op Note (Signed)
PATIENT NAME:  Roy ForehandSABOT, Barak P MR#:  [id number] DATE OF BIRTH:  1975-06-21  DATE OF PROCEDURE:  09/30/2012  PREOPERATIVE DIAGNOSES:   1.  Right upper extremity cellulitis.  2.  Substance abuse.    POSTOPERATIVE DIAGNOSES:  1.  Right upper extremity cellulitis.  2.  Substance abuse.    PROCEDURES:  1. Ultrasound guidance for vascular access to left brachial vein.  2. Fluoroscopic guidance for placement of catheter.  3. Insertion of peripherally inserted central venous catheter, double-lumen, left arm.  SURGEON: Annice NeedyJason S. Glenda Spelman, MD  ANESTHESIA: Local.   ESTIMATED BLOOD LOSS: Minimal.   INDICATION FOR PROCEDURE: A 39 year old male who apparently has a history of IV heroin use who presented with right upper extremity cellulitis and thrombophlebitis. He is to receive extended IV antibiotics, and we are asked to place a PICC line in the left upper extremity.   DESCRIPTION OF PROCEDURE: The patient's left arm was sterilely prepped and draped, and a sterile surgical field was created. The left brachial vein was accessed under direct ultrasound guidance without difficulty with a micropuncture needle and permanent image was recorded. 0.018 wire was then placed into the superior vena cava. Peel-away sheath was placed over the wire. A single lumen peripherally inserted central venous catheter was then placed over the wire and the wire and peel-away sheath were removed. The catheter tip was placed into the superior vena cava and was secured at the skin at 40 cm with a sterile dressing. The catheter withdrew blood well and flushed easily with heparinized saline. The patient tolerated procedure well.    ____________________________ Annice NeedyJason S. Jaunice Mirza, MD jsd:dp D: 10/03/2012 16:22:15 ET T: 10/03/2012 17:07:40 ET JOB#: [id number]  cc: Annice NeedyJason S. Kai Calico, MD, <Dictator> Annice NeedyJASON S Mackinsey Pelland MD ELECTRONICALLY SIGNED 10/11/2012 8:19

## 2014-06-27 NOTE — Consult Note (Signed)
PATIENT NAME:  Roy Gordon, Roy Gordon MR#:  [id number] DATE OF BIRTH:  1975-12-14  DATE OF CONSULTATION:  10/05/2012  REFERRING PHYSICIAN:   CONSULTING PHYSICIAN:  Audery Amel, MD  IDENTIFYING INFORMATION AND REASON FOR CONSULT: This is a 39 year old gentleman, who is currently in the hospital for treatment of a cellulitis in his arm. A consultation was for assistance with abdominal pain and excessive pains and sleepiness, question of withdrawal.   HISTORY OF PRESENT ILLNESS: Information obtained from the patient and the chart. The patient has been in the hospital since 07/27 when he came in for a painful cellulitis in his right antecubital area. He gives a history that he had been using intravenous heroin before coming into the hospital. He says that he had been doing about a half a bag to 1 bag a day, which is a relatively small amount by the standards of many heroin addicts. The patient tells me that currently he is having pain primarily in his right arm in the area of the cellulitis and also in his abdomen. The pain in his arm is constant and is relieved by the current doses of narcotics that he is being given. The pain in his abdomen is a constant aching on top of which he has intermittent stabbing sharp pains that come on about once every minute or 2 and only lasts for a couple of seconds. He denies that he is having nausea or vomiting today. He says that he has been having regular bowel movements without diarrhea or constipation. He does not feel like he is going to throw up. He has been able to eat food and keep it down and eating does not make any difference one way or the other to his abdominal pain. He says that the abdominal pain is the thing that he would most like to have get better. He also has chronic pain in his lower back, but says that is about the same as it always is. He is not reporting pain to me anywhere else. The patient does not report that he is having flu-like symptoms. As mentioned,  he is not having diarrhea or vomiting. He does not feel like he is having a chill. He tells me that his mood is currently a little bit depressed. He says he was feeling fine without depression before he came into the hospital, but now he is feeling more depressed and angry. He is particularly upset after learning that he is having renal problems. The patient denies any suicidal ideation. Denies any psychotic symptoms. He says that he has been unable to sleep very well because of the discomfort, but that last night was finally able to get a little bit of sleep when he laid on his side. The pain gets a little bit better when she lies on either his right or left side and is worse when he lays on his back.   PAST PSYCHIATRIC HISTORY: The patient has a history of admissions to the hospital in the past for depression and for substance abuse. He has abused benzodiazepines in the past. He has tended to minimize or deny his abuse of narcotics in the past, but in retrospect, he probably was abusing narcotics previously. He was last admitted to the behavioral health unit in March 2014. At that time, his diagnosis was substance-induced depression. He was discharged on Cymbalta. The patient tells me that he was not able to afford any of his medicine and has not been taking any prescription medicines at  home. The patient denies ever having attempted suicide in the past.   PAST MEDICAL HISTORY: The patient has a history of a back injury and has chronic back pain. He has been taking benzodiazepines and narcotics intermittently in the past as noted above. No other clear specific old medical history.   SOCIAL HISTORY: The patient is not married and he is currently living with his parents. He says he does try to support his children. He works primarily as a Corporate investment banker.   MENTAL STATUS EXAMINATION: The patient was awake and alert when I came into the room. He was cooperative with the interview. Eye contact was normal.  Psychomotor activity was limited by his pain and discomfort. Speech was decreased in total amount and was quiet. Affect was a little bit blunted and dysphoric, but not tearful. Mood was stated as being depressed. The patient denied suicidal or homicidal ideation. Denied any hallucinations. No evidence of delusions or paranoia. Appears to be of average intelligence. Judgment and insight reasonably adequate. He seems to be able to make reasonable medical decisions right now. Alert and oriented x 4.   CURRENT MEDICATIONS: When I came to see the patient, his current medicine list was Norvasc 5 mg a day, Zyvox 600 mg q.12 hours, Oxy-Contin 10 mg twice a day, Zosyn 3.375 grams q.12 hours, Zantac 150 mg a day, heparin subcutaneous in prophylactic doses, oxycodone/acetaminophen combination (Percocet) 5 mg every 6 hours p.r.n.   ALLERGIES: No known drug allergies.   ASSESSMENT: A 39 year old man with a history of substance abuse and depression. Currently in the hospital for a cellulitis that is probably directly caused by his drug abuse. Currently, he is complaining of some depression, but is not reporting suicidal ideation. He is not having psychotic symptoms. He does not appear to be acutely dangerous to himself. His chief complaint is his pain. The pain in his arm is of obvious cause. The pain in his abdomen is of unclear cause. He does not report to me, what sounds like, a complete syndrome of opiate withdrawal. Additionally, I would usually not expect the opiate withdrawal discomfort to be peaking 5 days after stopping the use as this is. Additionally,  you would expect the opiate withdrawal to be happening given that he is still receiving opiates for pain treatment. Therefore, I doubt that his abdominal pain is related to his opiates one way or the other. Additionally, it is important to recognize that for a person with identified opiate dependence, it is illegal to treat them with opiates for detox unless you  are aiming at complete detox in 3 days. Therefore, at this point, we should not be looking at treating opiate withdrawal symptoms. Instead, pain medication should be targeted just at relieving his pain.   TREATMENT PLAN: I have changed his p.r.n. pain medicine to 10 mg of Roxicodone q.6 hours and gotten rid of the acetaminophen. We will see if this slightly higher dose helps more with his pain. Additionally, I think I will try 20 mg of Bentyl twice a day to see if that helps with what might be abdominal cramping. Also, he describes the pain as having a "hick-up" sort of pattern. I am going to try giving him 2 mg of Haldol IV tonight to see if that breaks the pattern as that often helps with intractable hick-ups. I will try to come by and follow up with him over the weekend.   DIAGNOSIS, PRINCIPAL AND PRIMARY:  AXIS I:  1.  Adjustment disorder  with depressed mood.  2.  Opiate dependence.  3.  History of benzodiazepine dependence.  AXIS II: Deferred.  AXIS III: Acute renal failure and cellulitis.  AXIS IV: Severe from his acute illness.  AXIS V: Functioning at time of evaluation 50.   ____________________________ Audery AmelJohn T. Clapacs, MD jtc:aw D: 10/05/2012 14:20:42 ET T: 10/05/2012 15:10:53 ET JOB#: [id number]  cc: Audery AmelJohn T. Clapacs, MD, <Dictator> Audery AmelJOHN T CLAPACS MD ELECTRONICALLY SIGNED 10/07/2012 18:32

## 2014-06-27 NOTE — Consult Note (Signed)
Brief Consult Note: Diagnosis: benzo abuse, sub induced mood disorder.   Patient was seen by consultant.   Recommend further assessment or treatment.   Orders entered.   Discussed with Attending MD.   Comments: Psychiatry: Patient seen. Chart reviewed. Patient cut self. Abuse of benzos. [facility].  Electronic Signatures: Audery Amellapacs, John T (MD)  (Signed 05-Mar-14 17:26)  Authored: Brief Consult Note   Last Updated: 05-Mar-14 17:26 by Audery Amellapacs, John T (MD)

## 2015-02-26 ENCOUNTER — Encounter: Payer: Self-pay | Admitting: Emergency Medicine

## 2015-02-26 ENCOUNTER — Emergency Department: Payer: Medicaid Other

## 2015-02-26 ENCOUNTER — Inpatient Hospital Stay
Admission: EM | Admit: 2015-02-26 | Discharge: 2015-03-08 | DRG: 917 | Disposition: E | Payer: Self-pay | Attending: Internal Medicine | Admitting: Internal Medicine

## 2015-02-26 DIAGNOSIS — N170 Acute kidney failure with tubular necrosis: Secondary | ICD-10-CM | POA: Diagnosis present

## 2015-02-26 DIAGNOSIS — I472 Ventricular tachycardia: Secondary | ICD-10-CM | POA: Diagnosis present

## 2015-02-26 DIAGNOSIS — I4901 Ventricular fibrillation: Secondary | ICD-10-CM | POA: Diagnosis present

## 2015-02-26 DIAGNOSIS — Y95 Nosocomial condition: Secondary | ICD-10-CM | POA: Diagnosis present

## 2015-02-26 DIAGNOSIS — G936 Cerebral edema: Secondary | ICD-10-CM | POA: Insufficient documentation

## 2015-02-26 DIAGNOSIS — Z8619 Personal history of other infectious and parasitic diseases: Secondary | ICD-10-CM

## 2015-02-26 DIAGNOSIS — R4020 Unspecified coma: Secondary | ICD-10-CM | POA: Diagnosis present

## 2015-02-26 DIAGNOSIS — Z915 Personal history of self-harm: Secondary | ICD-10-CM

## 2015-02-26 DIAGNOSIS — G931 Anoxic brain damage, not elsewhere classified: Secondary | ICD-10-CM | POA: Diagnosis present

## 2015-02-26 DIAGNOSIS — R6521 Severe sepsis with septic shock: Secondary | ICD-10-CM | POA: Diagnosis present

## 2015-02-26 DIAGNOSIS — I213 ST elevation (STEMI) myocardial infarction of unspecified site: Secondary | ICD-10-CM | POA: Diagnosis present

## 2015-02-26 DIAGNOSIS — J690 Pneumonitis due to inhalation of food and vomit: Secondary | ICD-10-CM | POA: Diagnosis present

## 2015-02-26 DIAGNOSIS — T401X1A Poisoning by heroin, accidental (unintentional), initial encounter: Principal | ICD-10-CM | POA: Diagnosis present

## 2015-02-26 DIAGNOSIS — D696 Thrombocytopenia, unspecified: Secondary | ICD-10-CM | POA: Diagnosis present

## 2015-02-26 DIAGNOSIS — I2510 Atherosclerotic heart disease of native coronary artery without angina pectoris: Secondary | ICD-10-CM | POA: Diagnosis present

## 2015-02-26 DIAGNOSIS — Z8673 Personal history of transient ischemic attack (TIA), and cerebral infarction without residual deficits: Secondary | ICD-10-CM

## 2015-02-26 DIAGNOSIS — R748 Abnormal levels of other serum enzymes: Secondary | ICD-10-CM | POA: Diagnosis present

## 2015-02-26 DIAGNOSIS — A047 Enterocolitis due to Clostridium difficile: Secondary | ICD-10-CM | POA: Diagnosis present

## 2015-02-26 DIAGNOSIS — I469 Cardiac arrest, cause unspecified: Secondary | ICD-10-CM | POA: Diagnosis present

## 2015-02-26 DIAGNOSIS — T405X1A Poisoning by cocaine, accidental (unintentional), initial encounter: Secondary | ICD-10-CM | POA: Diagnosis present

## 2015-02-26 DIAGNOSIS — F1990 Other psychoactive substance use, unspecified, uncomplicated: Secondary | ICD-10-CM | POA: Diagnosis present

## 2015-02-26 DIAGNOSIS — Z66 Do not resuscitate: Secondary | ICD-10-CM | POA: Diagnosis present

## 2015-02-26 DIAGNOSIS — F1410 Cocaine abuse, uncomplicated: Secondary | ICD-10-CM | POA: Diagnosis present

## 2015-02-26 DIAGNOSIS — R34 Anuria and oliguria: Secondary | ICD-10-CM | POA: Diagnosis present

## 2015-02-26 DIAGNOSIS — G40901 Epilepsy, unspecified, not intractable, with status epilepticus: Secondary | ICD-10-CM | POA: Diagnosis present

## 2015-02-26 DIAGNOSIS — J9690 Respiratory failure, unspecified, unspecified whether with hypoxia or hypercapnia: Secondary | ICD-10-CM

## 2015-02-26 DIAGNOSIS — T50901A Poisoning by unspecified drugs, medicaments and biological substances, accidental (unintentional), initial encounter: Secondary | ICD-10-CM | POA: Diagnosis present

## 2015-02-26 DIAGNOSIS — J9601 Acute respiratory failure with hypoxia: Secondary | ICD-10-CM | POA: Diagnosis present

## 2015-02-26 DIAGNOSIS — Z452 Encounter for adjustment and management of vascular access device: Secondary | ICD-10-CM

## 2015-02-26 DIAGNOSIS — R4189 Other symptoms and signs involving cognitive functions and awareness: Secondary | ICD-10-CM

## 2015-02-26 DIAGNOSIS — Z820 Family history of epilepsy and other diseases of the nervous system: Secondary | ICD-10-CM

## 2015-02-26 DIAGNOSIS — T40991A Poisoning by other psychodysleptics [hallucinogens], accidental (unintentional), initial encounter: Secondary | ICD-10-CM | POA: Diagnosis present

## 2015-02-26 DIAGNOSIS — I252 Old myocardial infarction: Secondary | ICD-10-CM

## 2015-02-26 DIAGNOSIS — I468 Cardiac arrest due to other underlying condition: Secondary | ICD-10-CM | POA: Diagnosis present

## 2015-02-26 DIAGNOSIS — A419 Sepsis, unspecified organism: Secondary | ICD-10-CM | POA: Diagnosis present

## 2015-02-26 HISTORY — DX: Atherosclerotic heart disease of native coronary artery without angina pectoris: I25.10

## 2015-02-26 HISTORY — DX: Other psychoactive substance use, unspecified, uncomplicated: F19.90

## 2015-02-26 HISTORY — DX: Cerebral infarction, unspecified: I63.9

## 2015-02-26 HISTORY — DX: Major depressive disorder, single episode, unspecified: F32.9

## 2015-02-26 HISTORY — DX: Depression, unspecified: F32.A

## 2015-02-26 HISTORY — DX: Unspecified viral hepatitis C without hepatic coma: B19.20

## 2015-02-26 LAB — CBC WITH DIFFERENTIAL/PLATELET
BASOS ABS: 0.1 10*3/uL (ref 0–0.1)
BASOS PCT: 0 %
EOS PCT: 1 %
Eosinophils Absolute: 0.2 10*3/uL (ref 0–0.7)
HEMATOCRIT: 45.8 % (ref 40.0–52.0)
HEMOGLOBIN: 15.3 g/dL (ref 13.0–18.0)
Lymphocytes Relative: 33 %
Lymphs Abs: 7.9 10*3/uL — ABNORMAL HIGH (ref 1.0–3.6)
MCH: 30.4 pg (ref 26.0–34.0)
MCHC: 33.3 g/dL (ref 32.0–36.0)
MCV: 91.1 fL (ref 80.0–100.0)
Monocytes Absolute: 0.7 10*3/uL (ref 0.2–1.0)
Monocytes Relative: 3 %
Neutro Abs: 15.3 10*3/uL — ABNORMAL HIGH (ref 1.4–6.5)
Neutrophils Relative %: 63 %
Platelets: 232 10*3/uL (ref 150–440)
RBC: 5.02 MIL/uL (ref 4.40–5.90)
RDW: 13.6 % (ref 11.5–14.5)
WBC: 24.2 10*3/uL — AB (ref 3.8–10.6)

## 2015-02-26 LAB — COMPREHENSIVE METABOLIC PANEL
ALK PHOS: 102 U/L (ref 38–126)
ALT: 69 U/L — ABNORMAL HIGH (ref 17–63)
ANION GAP: 18 — AB (ref 5–15)
AST: 70 U/L — ABNORMAL HIGH (ref 15–41)
Albumin: 3.8 g/dL (ref 3.5–5.0)
BILIRUBIN TOTAL: 0.8 mg/dL (ref 0.3–1.2)
BUN: 19 mg/dL (ref 6–20)
CALCIUM: 8.2 mg/dL — AB (ref 8.9–10.3)
CO2: 16 mmol/L — ABNORMAL LOW (ref 22–32)
Chloride: 102 mmol/L (ref 101–111)
Creatinine, Ser: 1.73 mg/dL — ABNORMAL HIGH (ref 0.61–1.24)
GFR calc non Af Amer: 48 mL/min — ABNORMAL LOW (ref >60)
GFR, EST AFRICAN AMERICAN: 56 mL/min — AB (ref >60)
Glucose, Bld: 319 mg/dL — ABNORMAL HIGH (ref 65–99)
POTASSIUM: 4.5 mmol/L (ref 3.5–5.1)
Sodium: 136 mmol/L (ref 135–145)
TOTAL PROTEIN: 6.6 g/dL (ref 6.5–8.1)

## 2015-02-26 LAB — URINALYSIS COMPLETE WITH MICROSCOPIC (ARMC ONLY)
BACTERIA UA: NONE SEEN
Bilirubin Urine: NEGATIVE
GLUCOSE, UA: 150 mg/dL — AB
Ketones, ur: NEGATIVE mg/dL
LEUKOCYTES UA: NEGATIVE
NITRITE: NEGATIVE
PH: 6 (ref 5.0–8.0)
PROTEIN: 100 mg/dL — AB
SPECIFIC GRAVITY, URINE: 1.01 (ref 1.005–1.030)
SQUAMOUS EPITHELIAL / LPF: NONE SEEN

## 2015-02-26 LAB — URINE DRUG SCREEN, QUALITATIVE (ARMC ONLY)
Amphetamines, Ur Screen: NOT DETECTED
BARBITURATES, UR SCREEN: NOT DETECTED
BENZODIAZEPINE, UR SCRN: NOT DETECTED
CANNABINOID 50 NG, UR ~~LOC~~: NOT DETECTED
COCAINE METABOLITE, UR ~~LOC~~: POSITIVE — AB
MDMA (Ecstasy)Ur Screen: NOT DETECTED
METHADONE SCREEN, URINE: NOT DETECTED
Opiate, Ur Screen: POSITIVE — AB
Phencyclidine (PCP) Ur S: NOT DETECTED
TRICYCLIC, UR SCREEN: NOT DETECTED

## 2015-02-26 LAB — ACETAMINOPHEN LEVEL

## 2015-02-26 LAB — BLOOD GAS, ARTERIAL
Acid-base deficit: 8.9 mmol/L — ABNORMAL HIGH (ref 0.0–2.0)
Bicarbonate: 12.7 mEq/L — ABNORMAL LOW (ref 21.0–28.0)
FIO2: 0.5
MECHANICAL RATE: 16
MECHVT: 500 mL
O2 SAT: 99.9 %
PATIENT TEMPERATURE: 37
PCO2 ART: 20 mmHg — AB (ref 32.0–48.0)
PEEP: 5 cmH2O
PO2 ART: 273 mmHg — AB (ref 83.0–108.0)
pH, Arterial: 7.41 (ref 7.350–7.450)

## 2015-02-26 LAB — SALICYLATE LEVEL

## 2015-02-26 LAB — ETHANOL: Alcohol, Ethyl (B): <5 mg/dL (ref <5)

## 2015-02-26 LAB — LACTIC ACID, PLASMA: LACTIC ACID, VENOUS: 9 mmol/L — AB (ref 0.5–2.0)

## 2015-02-26 LAB — TROPONIN I: TROPONIN I: 0.41 ng/mL — AB (ref <0.031)

## 2015-02-26 MED ORDER — SODIUM CHLORIDE 0.9 % IV SOLN
0.0000 mg/h | INTRAVENOUS | Status: AC
  Administered 2015-02-27 (×2): 4 mg/h via INTRAVENOUS
  Administered 2015-02-27: 5 mg/h via INTRAVENOUS
  Administered 2015-02-28: 7.5 mg/h via INTRAVENOUS
  Administered 2015-02-28: 10 mg/h via INTRAVENOUS
  Filled 2015-02-26 (×8): qty 10

## 2015-02-26 MED ORDER — SODIUM CHLORIDE 0.9 % IV BOLUS (SEPSIS)
INTRAVENOUS | Status: AC
  Administered 2015-02-26: 1000 mL via INTRAVENOUS
  Filled 2015-02-26: qty 2000

## 2015-02-26 MED ORDER — SODIUM CHLORIDE 0.9 % IV BOLUS (SEPSIS)
1000.0000 mL | Freq: Once | INTRAVENOUS | Status: AC
  Administered 2015-02-26: 1000 mL via INTRAVENOUS

## 2015-02-26 MED ORDER — FENTANYL CITRATE (PF) 100 MCG/2ML IJ SOLN: 100.0000 ug | Freq: Once | INTRAMUSCULAR | Status: DC

## 2015-02-26 MED ORDER — FENTANYL 2500MCG IN NS 250ML (10MCG/ML) PREMIX INFUSION: 25.0000 ug/h | INTRAVENOUS | Status: DC

## 2015-02-26 MED ORDER — NOREPINEPHRINE BITARTRATE 1 MG/ML IV SOLN
0.0000 ug/min | INTRAVENOUS | Status: DC
  Administered 2015-02-28: 5 ug/min via INTRAVENOUS
  Administered 2015-02-28: 15 ug/min via INTRAVENOUS
  Filled 2015-02-26 (×5): qty 4

## 2015-02-26 MED ORDER — FENTANYL 2500MCG IN NS 250ML (10MCG/ML) PREMIX INFUSION
25.0000 ug/h | INTRAVENOUS | Status: DC
  Administered 2015-02-27: 150 ug/h via INTRAVENOUS
  Administered 2015-02-27: 250 ug/h via INTRAVENOUS
  Filled 2015-02-26 (×2): qty 250

## 2015-02-26 MED ORDER — SODIUM CHLORIDE 0.9 % IV SOLN: 1.0000 mg/h | INTRAVENOUS | Status: DC

## 2015-02-26 MED ORDER — ARTIFICIAL TEARS OP OINT
1.0000 "application " | TOPICAL_OINTMENT | Freq: Three times a day (TID) | OPHTHALMIC | Status: DC
  Filled 2015-02-26: qty 3.5

## 2015-02-26 MED ORDER — PROPOFOL 1000 MG/100ML IV EMUL
5.0000 ug/kg/min | Freq: Once | INTRAVENOUS | Status: AC
  Administered 2015-02-26: 10 ug/kg/min via INTRAVENOUS

## 2015-02-26 MED ORDER — ASPIRIN 300 MG RE SUPP
300.0000 mg | RECTAL | Status: AC
  Administered 2015-02-27: 300 mg via RECTAL
  Filled 2015-02-26: qty 1

## 2015-02-26 MED ORDER — MIDAZOLAM HCL 2 MG/2ML IJ SOLN: 2.0000 mg | Freq: Once | INTRAMUSCULAR | Status: DC

## 2015-02-26 MED ORDER — VECURONIUM BOLUS VIA INFUSION
0.0800 mg/kg | Freq: Once | INTRAVENOUS | Status: AC
  Administered 2015-02-27: 5.4 mg via INTRAVENOUS
  Filled 2015-02-26: qty 6

## 2015-02-26 MED ORDER — VECURONIUM BROMIDE 10 MG IV SOLR
0.8000 ug/kg/min | INTRAVENOUS | Status: DC
  Administered 2015-02-27: 1 ug/kg/min via INTRAVENOUS
  Filled 2015-02-26: qty 100

## 2015-02-26 MED ORDER — MIDAZOLAM BOLUS VIA INFUSION
2.0000 mg | INTRAVENOUS | Status: DC | PRN
  Filled 2015-02-26: qty 2

## 2015-02-26 MED ORDER — MIDAZOLAM HCL 2 MG/2ML IJ SOLN
2.0000 mg | Freq: Once | INTRAMUSCULAR | Status: DC | PRN
Start: 2015-02-26 — End: 2015-02-26

## 2015-02-26 MED ORDER — ARTIFICIAL TEARS OP OINT
1.0000 "application " | TOPICAL_OINTMENT | Freq: Three times a day (TID) | OPHTHALMIC | Status: DC
  Administered 2015-02-27 – 2015-03-03 (×14): 1 via OPHTHALMIC
  Filled 2015-02-26 (×2): qty 3.5

## 2015-02-26 MED ORDER — HEPARIN SODIUM (PORCINE) 5000 UNIT/ML IJ SOLN
5000.0000 [IU] | Freq: Three times a day (TID) | INTRAMUSCULAR | Status: DC
  Administered 2015-02-27 (×2): 5000 [IU] via SUBCUTANEOUS
  Filled 2015-02-26 (×2): qty 1

## 2015-02-26 MED ORDER — SODIUM CHLORIDE 0.9 % IV SOLN: 2000.0000 mL | Freq: Once | INTRAVENOUS | Status: DC

## 2015-02-26 MED ORDER — FENTANYL BOLUS VIA INFUSION
50.0000 ug | INTRAVENOUS | Status: DC | PRN
  Filled 2015-02-26: qty 50

## 2015-02-26 MED ORDER — MIDAZOLAM BOLUS VIA INFUSION: 2.0000 mg | INTRAVENOUS | Status: DC | PRN

## 2015-02-26 MED ORDER — FENTANYL BOLUS VIA INFUSION: 50.0000 ug | INTRAVENOUS | Status: DC | PRN

## 2015-02-26 MED ORDER — PROPOFOL 1000 MG/100ML IV EMUL
INTRAVENOUS | Status: AC
  Administered 2015-02-26: 10 ug/kg/min via INTRAVENOUS
  Filled 2015-02-26: qty 100

## 2015-02-26 MED ORDER — FENTANYL CITRATE (PF) 100 MCG/2ML IJ SOLN: 100.0000 ug | Freq: Once | INTRAMUSCULAR | Status: DC | PRN

## 2015-02-26 NOTE — ED Notes (Signed)
Family at bedside.  Patient's girlfriend and father.  Dr. Scotty CourtStafford at bedside.  Condition discussed.  Available to questions and discussions re:  immediate plan of care. Continue to monitor.  Continue to be available for family.

## 2015-02-26 NOTE — ED Notes (Signed)
Return from CT scan.

## 2015-02-26 NOTE — ED Notes (Signed)
Incontinent large amount of liquid stool.  Linen changed.  Skin care given.  Patient turned.  Tolerated well..Marland Kitchen

## 2015-02-26 NOTE — ED Notes (Signed)
PATIENT BILATERAL NECK, AXILLA, AND GROIN PACKED WITH ICE.  2 L CHILLED NS HUNG.

## 2015-02-26 NOTE — ED Notes (Signed)
According to out going Scientist, physiologicalN and Charge Nurse, family members were taking pictures of the Pt in the Pt's room.

## 2015-02-26 NOTE — ED Notes (Signed)
EMS called.  Patient on floor of bathroom unresponsive.  Patient's girlfriend told EMS that patient has been using heroin today.  [facility]  Arrest was witnessed PEA.  Defib x 1 for VTach.  Narcan 4 mg given x 1.  Patient was paced for 2 minutes.  3 EPI given by EMS.  Patient with history or Hep C and Drug abuse.  King airway in place.

## 2015-02-26 NOTE — H&P (Signed)
[facility]   PATIENT NAME: Roy Gordon    MR#:  [id number]  DATE OF BIRTH:  1976-02-11  DATE OF ADMISSION:  02/17/2015  PRIMARY CARE PHYSICIAN: No primary care provider on file.   REQUESTING/REFERRING PHYSICIAN: Paduchowski, MD  CHIEF COMPLAINT:   Chief Complaint  Patient presents with  . Cardiac Arrest    HISTORY OF PRESENT ILLNESS:  Marsha Hillman  is a 39 y.o. male who presents with heart attack arrest. Patient is a known IV heroin user and had a cardiac event witnessed by his girlfriend gas station earlier today. He was coded by EMS brought in the ED stabilized intubated after ROSC. Girlfriend stated that he may using heroin earlier today. His tox screen is positive for opiates as well as PCP. Hospitalists were called for intubation status post cardiac arrest with acute respiratory failure with ICU consult for hypothermic protocol.  PAST MEDICAL HISTORY:   Past Medical History  Diagnosis Date  . Hepatitis C   . Drug use   . Coronary artery disease     MI  . Stroke [facility])     PAST SURGICAL HISTORY:   Past Surgical History  Procedure Laterality Date  . Hernia repair      SOCIAL HISTORY:   Social History  Substance Use Topics  . Smoking status: Unknown If Ever Smoked  . Smokeless tobacco: Not on file  . Alcohol Use: No    FAMILY HISTORY:   Family History  Problem Relation Age of Onset  . Dementia      DRUG ALLERGIES:  Allergies no known allergies  MEDICATIONS AT HOME:   Prior to Admission medications   Not on File    REVIEW OF SYSTEMS:  Review of Systems  Unable to perform ROS: critical illness     VITAL SIGNS:   Filed Vitals:   02/22/2015 2115 02/15/2015 2151 02/12/2015 2157 02/14/2015 2259  BP: 140/96 142/101 135/95 128/97  Pulse: 105 112 111 97  Temp: 94.4 F (34.7 C) 95.2 F (35.1 C) 95.4 F (35.2 C) 93.7 F (34.3 C)  TempSrc: Oral  Other (Comment) Other (Comment)  Resp: Height:       Weight:      SpO2:  98% 98% 98%   Wt Readings from Last 3 Encounters:  02/12/2015 68.04 kg (150 lb)    PHYSICAL EXAMINATION:  Physical Exam  Vitals reviewed. Constitutional: He appears well-developed and well-nourished. No distress.  HENT:  Head: Normocephalic and atraumatic.  Mouth/Throat: Oropharynx is clear and moist.  Eyes: Conjunctivae and EOM are normal. Pupils are equal, round, and reactive to light. No scleral icterus.  Neck: Normal range of motion. Neck supple. No JVD present. No thyromegaly present.  Cardiovascular: Normal rate, regular rhythm and intact distal pulses.  Exam reveals no gallop and no friction rub.   No murmur heard. Respiratory: Breath sounds normal. No respiratory distress (intubated). He has no wheezes. He has no rales.  GI: Soft. Bowel sounds are normal. He exhibits no distension. There is no tenderness.  Musculoskeletal: Normal range of motion. He exhibits no edema.  No arthritis, no gout  Lymphadenopathy:    He has no cervical adenopathy.  Neurological:  Unable to assess due to critical illness  Skin: Skin is warm and dry. No rash noted. No erythema.  No track marks visualized  Psychiatric:  Unable to assess due to critical illness    LABORATORY PANEL:   CBC  Recent  Labs Lab 2014-11-20 2020  WBC 24.2*  HGB 15.3  HCT 45.8  PLT 232   ------------------------------------------------------------------------------------------------------------------  Chemistries   Recent Labs Lab 2014-11-20 2020  NA 136  K 4.5  CL 102  CO2 16*  GLUCOSE 319*  BUN 19  CREATININE 1.73*  CALCIUM 8.2*  AST 70*  ALT 69*  ALKPHOS 102  BILITOT 0.8   ------------------------------------------------------------------------------------------------------------------  Cardiac Enzymes  Recent Labs Lab 2014-11-20 2020  TROPONINI 0.41*    ------------------------------------------------------------------------------------------------------------------  RADIOLOGY:  Ct Head Wo Contrast  01-03-15  CLINICAL DATA:  Witnessed cardiac arrest.  Unresponsive. EXAM: CT HEAD WITHOUT CONTRAST TECHNIQUE: Contiguous axial images were obtained from the base of the skull through the vertex without intravenous contrast. COMPARISON:  12/09/2011 FINDINGS: Skull and Sinuses:No acute or traumatic finding Visualized orbits: Negative. Brain: There is blurred gray-white differentiation at the cortex, without discrete infarct, hemorrhage, hydrocephalus, or herniation. Deep gray nuclei are still distinct. High-density appearance of the major intracranial veins is likely related to brain edema. There is no historical findings to suggest primary venous occlusive disease. These results were called by telephone at the time of interpretation on 01-03-15 at 10:44 pm to Dr. Scotty CourtStafford, who verbally acknowledged these results. 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 Electronically Signed   By: Marnee SpringJonathon  Watts M.D.   On: 01-03-15 22:50   Dg Chest Portable 1 View  01-03-15  CLINICAL DATA:  EMS called. Patient on floor of bathroom unresponsive. Patient's girlfriend told EMS that patient has been using heroin today. [facility] Arrest was witnessed PE[facility]. Defib x 1 for VTach. EXAM: PORTABLE CHEST 1 VIEW COMPARISON:  05/05/2013 FINDINGS: Lungs are clear. No pleural effusion or gross pneumothorax. Heart, mediastinum hila are unremarkable. Endotracheal tube tip projects 3.5 cm above the carina. Orogastric tube passes below the diaphragm. IMPRESSION: 1. No acute cardiopulmonary disease. 2. Support apparatus is well positioned. Electronically Signed   By: Amie Portlandavid  Ormond M.D.   On: 01-03-15 20:52    EKG:   Orders placed or performed in visit on 03/03/12  . EKG 12-Lead    IMPRESSION AND PLAN:  Principal  Problem:   Cardiac arrest (HCC) - very possibly secondary to drug overdose. ICU consult and in the ED for management under hypothermic protocol. Monitor closely as there is concern for possible anoxic brain injury. Can pursue further testing for this once the patient has had some time to stabilize, and based on his improvement. Active Problems:   Drug overdose - tox screen positive for opiates and PCP. Girlfriend stated that he used heroin earlier in the day prior to his cardiac arrest.   Acute respiratory failure with hypoxia (HCC) - intubated, stable with mechanical ventilation at this time. Continue to monitor.   IV drug user - heroin and PCP on tox screen as above. Given the acute nature of his arrest is unlikely that this is related to any sort of blood or infection due to his IV drug use.  All the records are reviewed and case discussed with ED provider. Management plans discussed with the patient and/or family.  DVT PROPHYLAXIS: mechanical only - defer to hypothermic protocol per intensivist  GI PROPHYLAXIS: PPI  ADMISSION STATUS: Inpatient  CODE STATUS: Full  TOTAL CRITICAL CARE TIME TAKING CARE OF THIS PATIENT: 50 minutes.    Beryl Balz FIELDING 01-03-15, 11:32 PM  [facility]  Office  [phone]  CC: Primary care physician; No primary care provider on file.

## 2015-02-26 NOTE — ED Notes (Addendum)
ON VENTILATOR.  Ac 16  Vt:  500  PEEP:  5  FiO2:  0.50.

## 2015-02-26 NOTE — ED Notes (Signed)
abg DRAWN AND WALKED TO LAB BY RCP.

## 2015-02-26 NOTE — ED Notes (Addendum)
SPOKE WITH [facility] LAW ENFORCEMENT.  CARDIAC ARREST WAS UNWITNESSED.  PATIENT W[facility] TO USE THE [facility]HROOM AND GIRLFRIEND STAYED OUT IN THE CAR AND ESTIMATES IT TO BE AROUND 15 MINUTES BEFORE SHE WENT INTO STORE TO CHECK ON PATIENT.  GIRLFRIEND FOUND PATIENT ON FLOOR OF BATHROOM.

## 2015-02-26 NOTE — ED Notes (Signed)
BAIR HUGGAR REMOVED

## 2015-02-26 NOTE — ED Notes (Signed)
BAIR HUGGAR ON PATIENT.

## 2015-02-26 NOTE — ED Notes (Signed)
Sherilyn CooterHenry RN received report from American Electric PowerJane RN, pt is hemodynamically stable awaiting for admission to the ICU.

## 2015-02-26 NOTE — ED Provider Notes (Signed)
[facility] Emergency Department Provider Note  Time seen: 8:28 PM  I have reviewed the triage vital signs and the nursing notes.   HISTORY  Chief Complaint Cardiac Arrest    HPI Roy Gordon is a 39 y.o. male for the past medical history of hepatitis C and reported hair when he was presents the emergency department after cardiac arrest. According to EMS the patient was at a gas station, with a witnessed cardiac arrest by his girlfriend. Girlfriend states the patient has been using heroin today. Per EMS patient underwent CPR for approximately 15 minutes, initially was PE a rest however was in ventricular fibrillation and was shocked with return of spontaneous circulation. Patient received a total of 4 mg of Narcan, as well as 3 epinephrine doses. The [facility] airway was placed, and the patient presented to the emergency department with a pulse intact, and a blood pressure 70/30. Patient unresponsive upon arrival.     Past Medical History  Diagnosis Date  . Hepatitis C   . Drug use     There are no active problems to display for this patient.   No past surgical history on file.  No current outpatient prescriptions on file.  Allergies Review of patient's allergies indicates no known allergies.  History reviewed. No pertinent family history.  Social History Social History  Substance Use Topics  . Smoking status: Unknown If Ever Smoked  . Smokeless tobacco: None  . Alcohol Use: None    Review of Systems Unable to obtain review of systems, unresponsive ____________________________________________   PHYSICAL EXAM:  VITAL SIGNS: ED Triage Vitals  Enc Vitals Group     BP March 13, 2015 2014 71/55 mmHg     Pulse Rate Mar 13, 2015 2014 94     Resp 2015-03-13 2018 16     Temp --      Temp src --      SpO2 13-Mar-2015 2014 96 %     Weight March 13, 2015 2014 150 lb (68.04 kg)     Height 03/13/2015 2014  (1.778 m)     Head Cir --      Peak Flow --      Pain  Score --      Pain Loc --      Pain Edu? --      Excl. in GC? --     Constitutional: Unresponsive, spontaneous pulse, apneic with a King airway, easily bagged with a Ambu bag Eyes: 2 mm, minimal response to light ENT   Head: Normocephalic and atraumatic.   Mouth/Throat: King airway in place Cardiovascular: Regular rhythm, rate around 100-110 bpm Respiratory: No spontaneous breath, bags easily with Ambu bag, equal breath sounds bilaterally Gastrointestinal: Soft, no distention, atraumatic, normal external GU Musculoskeletal: Does not move any extremities, extremities appear atraumatic Neurologic:  Unresponsive, no movement to painful stimuli Skin:  Skin is warm, dry and intact.  Psychiatric: Responsive  ____________________________________________    EKG  EKG reviewed and interpreted by myself shows sinus rhythm at 98 bpm, narrow QRS, normal axis, normal intervals, nonspecific ST changes.  ____________________________________________    RADIOLOGY  Chest x-ray shows well-positioned ET tube.  ____________________________________________ PROCEDURES  Procedure(s) performed: Intubation, central line  Critical Care performed: Yes, see critical care note(s)  ____________________________________________    INITIAL IMPRESSION / ASSESSMENT AND PLAN / ED COURSE  Pertinent labs & imaging results that were available during my care of the patient were reviewed by me and considered in my medical decision making (see chart for  details).  Patient presents the emergency department after cardiac arrest, approximately 15 minutes of CPR, status post defibrillation for ventricular fibrillation with return of spontaneous circulation. On arrival the patient is apneic, King airway in place bags easily. Makes no movements, no response to painful stimuli. Pupils are 2 mm with sluggish reaction bilaterally. Initial blood pressure 70/50. Patient presented with an intraosseous line in the  right tibia. We had a great deal of difficulty obtaining venous access, I placed a triple lumen catheter to his right femoral vein. Blood pressure at that time was approximately 120 systolic. Patient had begun to take spontaneous breaths at times continued to have no response to painful stimuli. King airway was swapped out with an ET tube. Patient placed on propofol for sedation. Labs have been sent, we'll continue to closely monitor the patient on a cardiac monitor  Please are now here, they have spoken with a girlfriend who states the girlfriend was in the car, she did not go in the bathroom with him, the patient was gone for approximately 15 minutes before they found the patient unconscious on the floor. Likely had a prolonged downtime. Lactate of 9, elevated troponin hypothermic at 94  Discussed with [facility], we have decided to initiate therapeutic hypothermia. Discussed with the hospitalist who will be admitting. We have placed ice packs on the patient, infusing cold saline. We'll obtain a CT head to rule out intracranial abnormality/hemorrhage.      CRITICAL CARE Performed by: Minna AntisPADUCHOWSKI, Vito Beg   Total critical care time: 60 minutes  Critical care time was exclusive of separately billable procedures and treating other patients.  Critical care was necessary to treat or prevent imminent or life-threatening deterioration.  Critical care was time spent personally by me on the following activities: development of treatment plan with patient and/or surrogate as well as nursing, discussions with consultants, evaluation of patient's response to treatment, examination of patient, obtaining history from patient or surrogate, ordering and performing treatments and interventions, ordering and review of laboratory studies, ordering and review of radiographic studies, pulse oximetry and re-evaluation of patient's condition.  INTUBATION Performed by: Minna AntisPADUCHOWSKI, Tyreak Reagle  Required items: required blood  products, implants, devices, and special equipment available Patient identity confirmed: provided demographic data and hospital-assigned identification number Time out: Immediately prior to procedure a "time out" was called to verify the correct patient, procedure, equipment, support staff and site/side marked as required.  Indications: Cardiac arrest   Intubation method: A 3.0 Glidescope Laryngoscopy   Preoxygenation: 100% BVM  Sedatives: None required  Paralytic: None required   Tube Size: 8.0 cuffed  Post-procedure assessment: chest rise and ETCO2 monitor Breath sounds: equal and absent over the epigastrium Tube secured with: ETT holder Chest x-ray interpreted by radiologist and me.  Chest x-ray findings: endotracheal tube in appropriate position  Patient tolerated the procedure well with no immediate complications.  CENTRAL LINE Performed by: Minna AntisPADUCHOWSKI, Lamin Chandley Consent: The procedure was performed in an emergent situation. Required items: required blood products, implants, devices, and special equipment available Patient identity confirmed: arm band and provided demographic data Indications: vascular access Anesthesia: local infiltration Local anesthetic: lidocaine 1% with epinephrine Anesthetic total: 3 ml Patient sedated: no Preparation: skin prepped with 2% chlorhexidine Skin prep agent dried: skin prep agent completely dried prior to procedure Sterile barriers: all five maximum sterile barriers used - cap, mask, sterile gown, sterile gloves, and large sterile sheet Hand hygiene: hand hygiene performed prior to central venous catheter insertion  Location details: Right femoral vein  Catheter type: triple lumen Catheter size: 8 Fr Pre-procedure: landmarks identified Ultrasound guidance: No  Successful placement: yes Post-procedure: line sutured and dressing applied Assessment: blood return through all parts, free fluid  flow     ____________________________________________   FINAL CLINICAL IMPRESSION(S) / ED DIAGNOSES  Successful resuscitation Cardiac arrest Heroin overdose   Minna Antis, MD 03/10/15 2202

## 2015-02-27 ENCOUNTER — Encounter: Payer: Self-pay | Admitting: Physician Assistant

## 2015-02-27 ENCOUNTER — Inpatient Hospital Stay: Payer: Medicaid Other

## 2015-02-27 DIAGNOSIS — I469 Cardiac arrest, cause unspecified: Secondary | ICD-10-CM

## 2015-02-27 DIAGNOSIS — F1990 Other psychoactive substance use, unspecified, uncomplicated: Secondary | ICD-10-CM

## 2015-02-27 DIAGNOSIS — T50902S Poisoning by unspecified drugs, medicaments and biological substances, intentional self-harm, sequela: Secondary | ICD-10-CM

## 2015-02-27 DIAGNOSIS — I214 Non-ST elevation (NSTEMI) myocardial infarction: Secondary | ICD-10-CM

## 2015-02-27 DIAGNOSIS — J9601 Acute respiratory failure with hypoxia: Secondary | ICD-10-CM

## 2015-02-27 DIAGNOSIS — G9340 Encephalopathy, unspecified: Secondary | ICD-10-CM

## 2015-02-27 DIAGNOSIS — G936 Cerebral edema: Secondary | ICD-10-CM

## 2015-02-27 LAB — BASIC METABOLIC PANEL
ANION GAP: 8 (ref 5–15)
Anion gap: 7 (ref 5–15)
Anion gap: 5 (ref 5–15)
Anion gap: 6 (ref 5–15)
Anion gap: 8 (ref 5–15)
Anion gap: 7 (ref 5–15)
BUN: 19 mg/dL (ref 6–20)
BUN: 19 mg/dL (ref 6–20)
BUN: 19 mg/dL (ref 6–20)
BUN: 19 mg/dL (ref 6–20)
BUN: 16 mg/dL (ref 6–20)
BUN: 15 mg/dL (ref 6–20)
CALCIUM: 8.2 mg/dL — AB (ref 8.9–10.3)
CALCIUM: 8.5 mg/dL — AB (ref 8.9–10.3)
CALCIUM: 8.5 mg/dL — AB (ref 8.9–10.3)
CALCIUM: 8.2 mg/dL — AB (ref 8.9–10.3)
CALCIUM: 8.3 mg/dL — AB (ref 8.9–10.3)
CO2: 22 mmol/L (ref 22–32)
CO2: 20 mmol/L — ABNORMAL LOW (ref 22–32)
CO2: 22 mmol/L (ref 22–32)
CO2: 17 mmol/L — AB (ref 22–32)
CO2: 18 mmol/L — AB (ref 22–32)
CO2: 17 mmol/L — AB (ref 22–32)
CREATININE: 1.23 mg/dL (ref 0.61–1.24)
CREATININE: 1.48 mg/dL — AB (ref 0.61–1.24)
CREATININE: 1.28 mg/dL — AB (ref 0.61–1.24)
CREATININE: 0.89 mg/dL (ref 0.61–1.24)
Calcium: 8.1 mg/dL — ABNORMAL LOW (ref 8.9–10.3)
Chloride: 110 mmol/L (ref 101–111)
Chloride: 113 mmol/L — ABNORMAL HIGH (ref 101–111)
Chloride: 111 mmol/L (ref 101–111)
Chloride: 113 mmol/L — ABNORMAL HIGH (ref 101–111)
Chloride: 114 mmol/L — ABNORMAL HIGH (ref 101–111)
Chloride: 113 mmol/L — ABNORMAL HIGH (ref 101–111)
Creatinine, Ser: 1.11 mg/dL (ref 0.61–1.24)
Creatinine, Ser: 0.81 mg/dL (ref 0.61–1.24)
GFR calc Af Amer: >60 mL/min (ref >60)
GFR calc non Af Amer: >60 mL/min (ref >60)
GFR calc non Af Amer: 58 mL/min — ABNORMAL LOW (ref >60)
GFR calc non Af Amer: >60 mL/min (ref >60)
GFR calc non Af Amer: >60 mL/min (ref >60)
GFR calc non Af Amer: >60 mL/min (ref >60)
GLUCOSE: 159 mg/dL — AB (ref 65–99)
Glucose, Bld: 126 mg/dL — ABNORMAL HIGH (ref 65–99)
Glucose, Bld: 125 mg/dL — ABNORMAL HIGH (ref 65–99)
Glucose, Bld: 105 mg/dL — ABNORMAL HIGH (ref 65–99)
Glucose, Bld: 120 mg/dL — ABNORMAL HIGH (ref 65–99)
Glucose, Bld: 104 mg/dL — ABNORMAL HIGH (ref 65–99)
POTASSIUM: 3.6 mmol/L (ref 3.5–5.1)
POTASSIUM: 3.9 mmol/L (ref 3.5–5.1)
Potassium: 3.9 mmol/L (ref 3.5–5.1)
Potassium: 4 mmol/L (ref 3.5–5.1)
Potassium: 4 mmol/L (ref 3.5–5.1)
Potassium: 3.6 mmol/L (ref 3.5–5.1)
SODIUM: 138 mmol/L (ref 135–145)
SODIUM: 139 mmol/L (ref 135–145)
SODIUM: 138 mmol/L (ref 135–145)
Sodium: 139 mmol/L (ref 135–145)
Sodium: 139 mmol/L (ref 135–145)
Sodium: 138 mmol/L (ref 135–145)

## 2015-02-27 LAB — CBC
HCT: 49 % (ref 40.0–52.0)
HEMATOCRIT: 47.4 % (ref 40.0–52.0)
HEMOGLOBIN: 16.5 g/dL (ref 13.0–18.0)
HEMOGLOBIN: 16.7 g/dL (ref 13.0–18.0)
MCH: 29.6 pg (ref 26.0–34.0)
MCH: 31.3 pg (ref 26.0–34.0)
MCHC: 33.7 g/dL (ref 32.0–36.0)
MCHC: 35.3 g/dL (ref 32.0–36.0)
MCV: 87.7 fL (ref 80.0–100.0)
MCV: 88.6 fL (ref 80.0–100.0)
Platelets: 162 10*3/uL (ref 150–440)
Platelets: 154 10*3/uL (ref 150–440)
RBC: 5.59 MIL/uL (ref 4.40–5.90)
RBC: 5.35 MIL/uL (ref 4.40–5.90)
RDW: 13.3 % (ref 11.5–14.5)
RDW: 13.1 % (ref 11.5–14.5)
WBC: 23 10*3/uL — AB (ref 3.8–10.6)
WBC: 21 10*3/uL — ABNORMAL HIGH (ref 3.8–10.6)

## 2015-02-27 LAB — GLUCOSE, CAPILLARY
GLUCOSE-CAPILLARY: 139 mg/dL — AB (ref 65–99)
GLUCOSE-CAPILLARY: 139 mg/dL — AB (ref 65–99)
GLUCOSE-CAPILLARY: 138 mg/dL — AB (ref 65–99)
GLUCOSE-CAPILLARY: 98 mg/dL (ref 65–99)
GLUCOSE-CAPILLARY: 91 mg/dL (ref 65–99)
GLUCOSE-CAPILLARY: 98 mg/dL (ref 65–99)
GLUCOSE-CAPILLARY: 94 mg/dL (ref 65–99)
GLUCOSE-CAPILLARY: 77 mg/dL (ref 65–99)
GLUCOSE-CAPILLARY: 106 mg/dL — AB (ref 65–99)
GLUCOSE-CAPILLARY: 106 mg/dL — AB (ref 65–99)
Glucose-Capillary: 104 mg/dL — ABNORMAL HIGH (ref 65–99)
Glucose-Capillary: 107 mg/dL — ABNORMAL HIGH (ref 65–99)
Glucose-Capillary: 120 mg/dL — ABNORMAL HIGH (ref 65–99)
Glucose-Capillary: 122 mg/dL — ABNORMAL HIGH (ref 65–99)
Glucose-Capillary: 122 mg/dL — ABNORMAL HIGH (ref 65–99)
Glucose-Capillary: 140 mg/dL — ABNORMAL HIGH (ref 65–99)
Glucose-Capillary: 50 mg/dL — ABNORMAL LOW (ref 65–99)
Glucose-Capillary: 76 mg/dL (ref 65–99)
Glucose-Capillary: 88 mg/dL (ref 65–99)
Glucose-Capillary: 94 mg/dL (ref 65–99)

## 2015-02-27 LAB — BLOOD GAS, ARTERIAL
ACID-BASE DEFICIT: 7.8 mmol/L — AB (ref 0.0–2.0)
Acid-base deficit: 7.1 mmol/L — ABNORMAL HIGH (ref 0.0–2.0)
Bicarbonate: 19.7 mEq/L — ABNORMAL LOW (ref 21.0–28.0)
Bicarbonate: 16.8 mEq/L — ABNORMAL LOW (ref 21.0–28.0)
FIO2: 0.35
FIO2: 30
LHR: 450 {breaths}/min
LHR: 24 {breaths}/min
Mechanical Rate: 14
O2 Saturation: 99.6 %
O2 Saturation: 99.6 %
PATIENT TEMPERATURE: 37
PCO2 ART: 41 mmHg (ref 32.0–48.0)
PEEP/CPAP: 5 cmH2O
PEEP: 5 cmH2O
PH ART: 7.28 — AB (ref 7.350–7.450)
Patient temperature: 34.1
VT: 450 mL
pCO2 arterial: 29 mmHg — ABNORMAL LOW (ref 32.0–48.0)
pH, Arterial: 7.37 (ref 7.350–7.450)
pO2, Arterial: 182 mmHg — ABNORMAL HIGH (ref 83.0–108.0)
pO2, Arterial: 181 mmHg — ABNORMAL HIGH (ref 83.0–108.0)

## 2015-02-27 LAB — CLOSTRIDIUM DIFFICILE BY PCR: CDIFFPCR: POSITIVE — AB

## 2015-02-27 LAB — APTT
APTT: 32 s (ref 24–36)
APTT: 75 s — AB (ref 24–36)

## 2015-02-27 LAB — TROPONIN I
TROPONIN I: 31.27 ng/mL — AB (ref <0.031)
TROPONIN I: 14.9 ng/mL — AB (ref <0.031)
Troponin I: 32.94 ng/mL — ABNORMAL HIGH (ref <0.031)
Troponin I: 27.11 ng/mL — ABNORMAL HIGH (ref <0.031)

## 2015-02-27 LAB — HEPARIN LEVEL (UNFRACTIONATED)
Heparin Unfractionated: 0.62 IU/mL (ref 0.30–0.70)
Heparin Unfractionated: 0.79 IU/mL — ABNORMAL HIGH (ref 0.30–0.70)

## 2015-02-27 LAB — C DIFFICILE QUICK SCREEN W PCR REFLEX
C DIFFICILE (CDIFF) TOXIN: NEGATIVE
C DIFFICLE (CDIFF) ANTIGEN: POSITIVE — AB

## 2015-02-27 LAB — LACTIC ACID, PLASMA: Lactic Acid, Venous: 1.4 mmol/L (ref 0.5–2.0)

## 2015-02-27 LAB — PROTIME-INR
INR: 1.19
INR: 1.19
Prothrombin Time: 15.3 seconds — ABNORMAL HIGH (ref 11.4–15.0)
Prothrombin Time: 15.3 seconds — ABNORMAL HIGH (ref 11.4–15.0)

## 2015-02-27 LAB — OCCULT BLOOD X 1 CARD TO LAB, STOOL: FECAL OCCULT BLD: POSITIVE — AB

## 2015-02-27 LAB — MRSA PCR SCREENING: MRSA BY PCR: NEGATIVE

## 2015-02-27 MED ORDER — PANTOPRAZOLE SODIUM 40 MG IV SOLR
40.0000 mg | INTRAVENOUS | Status: DC
  Administered 2015-02-27: 40 mg via INTRAVENOUS
  Filled 2015-02-27: qty 40

## 2015-02-27 MED ORDER — PANTOPRAZOLE SODIUM 40 MG IV SOLR
40.0000 mg | Freq: Every day | INTRAVENOUS | Status: DC
  Administered 2015-02-27 – 2015-03-01 (×3): 40 mg via INTRAVENOUS
  Filled 2015-02-27 (×3): qty 40

## 2015-02-27 MED ORDER — HEPARIN (PORCINE) IN NACL 100-0.45 UNIT/ML-% IJ SOLN
400.0000 [IU]/h | INTRAMUSCULAR | Status: DC
  Administered 2015-02-27: 550 [IU]/h via INTRAVENOUS
  Filled 2015-02-27: qty 250

## 2015-02-27 MED ORDER — CHLORHEXIDINE GLUCONATE 0.12% ORAL RINSE (MEDLINE KIT)
15.0000 mL | Freq: Two times a day (BID) | OROMUCOSAL | Status: DC
  Administered 2015-02-27 – 2015-03-03 (×9): 15 mL via OROMUCOSAL
  Filled 2015-02-27 (×6): qty 15

## 2015-02-27 MED ORDER — ASPIRIN 325 MG PO TABS
325.0000 mg | ORAL_TABLET | Freq: Every day | ORAL | Status: DC
  Administered 2015-02-27 – 2015-03-02 (×4): 325 mg
  Filled 2015-02-27 (×4): qty 1

## 2015-02-27 MED ORDER — ANTISEPTIC ORAL RINSE SOLUTION (CORINZ)
7.0000 mL | OROMUCOSAL | Status: DC
  Administered 2015-02-27 – 2015-03-03 (×42): 7 mL via OROMUCOSAL
  Filled 2015-02-27 (×22): qty 7

## 2015-02-27 MED ORDER — VANCOMYCIN 50 MG/ML ORAL SOLUTION
125.0000 mg | Freq: Four times a day (QID) | ORAL | Status: DC
  Administered 2015-02-27 – 2015-03-02 (×16): 125 mg
  Filled 2015-02-27 (×20): qty 2.5

## 2015-02-27 MED ORDER — FENTANYL 2500MCG IN NS 250ML (10MCG/ML) PREMIX INFUSION: 10.0000 ug/h | INTRAVENOUS | Status: DC

## 2015-02-27 NOTE — Progress Notes (Signed)
ANTICOAGULATION CONSULT NOTE - Initial Consult  Pharmacy Consult for heparin Indication: chest pain/ACS  No Known Allergies  Patient Measurements: Height: 5\' 10"  (177.8 cm) Weight: 150 lb (68.04 kg) IBW/kg (Calculated) : 73 Heparin Dosing Weight: 68 kg  Vital Signs: Temp: 87.3 F (30.7 C) (12/23 1200) Temp Source: Core (Comment) (12/23 1200) BP: 113/85 mmHg (12/23 1200) Pulse Rate: 67 (12/23 1200)  Labs:  Recent Labs  12-27-14 2020 02/27/15 0220 02/27/15 0442 02/27/15 0601 02/27/15 0743 02/27/15 1122 02/27/15 1150  HGB 15.3 16.5 16.7  --   --   --   --   HCT 45.8 49.0 47.4  --   --   --   --   PLT 232 162 154  --   --   --   --   APTT  --  32  --   --  75*  --   --   LABPROT  --  15.3*  --   --  15.3*  --   --   INR  --  1.19  --   --  1.19  --   --   HEPARINUNFRC  --   --   --   --   --   --  0.62  CREATININE 1.73* 1.11  --  1.23 1.48*  --   --   TROPONINI 0.41* 31.27* 32.94*  --   --  27.11*  --     Estimated Creatinine Clearance: 64.5 mL/min (by C-G formula based on Cr of 1.48).   Medical History: Past Medical History  Diagnosis Date  . Hepatitis C   . 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 Coronary artery disease   . Stroke (HCC)   . Depression     a. prior suicide attempts    Medications:  Infusions:  . fentaNYL infusion INTRAVENOUS 150 mcg/hr (02/27/15 1200)  . heparin 550 Units/hr (02/27/15 1200)  . midazolam (VERSED) infusion 4 mg/hr (02/27/15 1206)  . norepinephrine (LEVOPHED) Adult infusion Stopped (02/27/15 0207)  . vecuronium (NORCURON) infusion 0.6 mcg/kg/min (02/27/15 1200)    Assessment: 39 yom cc ACS post drug over dose. Currently on hypothermia protocol and has received 5000 units subcutaneous x 2 doses.  HL= 0.62 Goal of Therapy:  Heparin level 0.3-0.7 units/ml Monitor platelets by anticoagulation protocol: Yes   Plan:  Heparin level is at goal so will continue heparin infusion at 550 units/hr and check a confirmatory level  in 6 hours.   Luisa Harthristy, Trinna Kunst D, Pharm.D., BCPS Clinical Pharmacist 02/27/2015,12:49 PM

## 2015-02-27 NOTE — Progress Notes (Signed)
Initial Nutrition Assessment    INTERVENTION:   EN: recommend initiation of EN as soon as clinically feasible   NUTRITION DIAGNOSIS:   Inadequate oral intake related to acute illness, inability to eat as evidenced by NPO status.  GOAL:   Provide needs based on ASPEN/SCCM guidelines  MONITOR:    (Energy Intake, Anthropometrics, Digestive System, Electrolyte/Renal Profile, Glucose Profile, Pulmonary)  REASON FOR ASSESSMENT:   Ventilator    ASSESSMENT:    Pt admitted s/p PEA and Vfib arrest, CT shows cerebral edema, pt on hypothermic protocol; +heroin and PCP. Pt currently sedated on vent  Past Medical History  Diagnosis Date  . Hepatitis C   . Drug use   . Coronary artery disease     MI  . Stroke [facility](HCC)     Diet Order:  Diet NPO time specified  Skin:  Reviewed, no issues  Digestive system: +diarrhea, C.diff positive; rectal tube in place; abdomen soft, BS active; OG to LIS  Electrolyte and Renal Profile:  Recent Labs Lab 02/27/15 0220 02/27/15 0601 02/27/15 0743  BUN 19 19 19   CREATININE 1.11 1.23 1.48*  NA 139 138 139  K 3.6 3.9 4.0   Glucose Profile:   Recent Labs  02/27/15 1000 02/27/15 1102 02/27/15 1156  GLUCAP 91 104* 94   Protein Profile:   Recent Labs Lab 04-20-14 2020  ALBUMIN 3.8   Meds: levophed, diprivan  Height:   Ht Readings from Last 1 Encounters:  04-20-14 5\' 10"  (1.778 m)    Weight:   Wt Readings from Last 1 Encounters:  04-20-14 150 lb (68.04 kg)     BMI:  Body mass index is 21.52 kg/(m^2).  Estimated Nutritional Needs:   Kcal:  Assess on follow-up when re-warmed or when nutrition to be initiated  Protein:  102-136 g(1.5-2.0 g/kg)   Fluid:  1700-2040 mL (25-30 ml/kg)   HIGH Care Level  Romelle Starcherate Marlei Glomski MS, RD, LDN [phone] Pager  [phone] Weekend/On-Call Pager

## 2015-02-27 NOTE — ED Notes (Signed)
Vent settings: AC at 14bpm; TV 450; 35%; PEEP 5

## 2015-02-27 NOTE — Progress Notes (Signed)
Dr. Delton CoombesByrum notified of critical troponin of 31.27 and positive C-Diff results. No new orders as of now. Will continue to monitor.

## 2015-02-27 NOTE — Progress Notes (Signed)
ANTICOAGULATION CONSULT NOTE - Initial Consult  Pharmacy Consult for heparin Indication: chest pain/ACS  No Known Allergies  Patient Measurements: Height: 5\' 10"  (177.8 cm) Weight: 150 lb (68.04 kg) IBW/kg (Calculated) : 73 Heparin Dosing Weight: 68 kg  Vital Signs: Temp: 88.7 F (31.5 C) (12/23 2115) Temp Source: Core (Comment) (12/23 1300) BP: 105/83 mmHg (12/23 2100) Pulse Rate: 64 (12/23 2115)  Labs:  Recent Labs  2015-02-21 2020 02/27/15 0220 02/27/15 0442  02/27/15 0743 02/27/15 1122 02/27/15 1150 02/27/15 1747 02/27/15 1921  HGB 15.3 16.5 16.7  --   --   --   --   --   --   HCT 45.8 49.0 47.4  --   --   --   --   --   --   PLT 232 162 154  --   --   --   --   --   --   APTT  --  32  --   --  75*  --   --   --   --   LABPROT  --  15.3*  --   --  15.3*  --   --   --   --   INR  --  1.19  --   --  1.19  --   --   --   --   HEPARINUNFRC  --   --   --   --   --   --  0.62  --  0.79*  CREATININE 1.73* 1.11  --   < > 1.48*  --  1.28* 0.89  --   TROPONINI 0.41* 31.27* 32.94*  --   --  27.11*  --  14.90*  --   < > = values in this interval not displayed.  Estimated Creatinine Clearance: 107.2 mL/min (by C-G formula based on Cr of 0.89).   Medical History: Past Medical History  Diagnosis Date  . Hepatitis C   . 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 Coronary artery disease   . Stroke (HCC)   . Depression     a. prior suicide attempts    Medications:  Infusions:  . fentaNYL infusion INTRAVENOUS 150 mcg/hr (02/27/15 2000)  . heparin 550 Units/hr (02/27/15 2000)  . midazolam (VERSED) infusion 5 mg/hr (02/27/15 2000)  . norepinephrine (LEVOPHED) Adult infusion Stopped (02/27/15 0207)  . vecuronium (NORCURON) infusion 0.6 mcg/kg/min (02/27/15 2000)    Assessment: 39 yom cc ACS post drug over dose. Currently on hypothermia protocol and has received 5000 units subcutaneous x 2 doses.  HL= 0.79 Goal of Therapy:  Heparin level 0.3-0.7 units/ml Monitor  platelets by anticoagulation protocol: Yes   Plan:  Heparin level is above goal so will decrease heparin infusion to 450 units/hr and check a heparin level in 6 hours.   Foye DeerLisa G Nhyla Nappi, Pharm.D., BCPS Clinical Pharmacist 02/27/2015,9:43 PM

## 2015-02-27 NOTE — Consult Note (Signed)
[facility] Consultation      Name: Roy Gordon MRN: [id number] DOB: [birth year]    ADMISSION DATE:  02/21/2015   CHIEF COMPLAINT:    acute resp failure and cardiac arrest   HISTORY OF PRESENT ILLNESS  39 y.o. male for the past medical history of hepatitis C presents the emergency department after cardiac arrest. According to EMS the patient was at a gas station, with a witnessed cardiac arrest by his girlfriend. Per report Girlfriend states the patient has been using heroin today. Per EMS patient underwent CPR for approximately 15 minutes, initially was PEA  Arrest then  was in ventricular fibrillation and was shocked with return of spontaneous circulation. Patient received a total of 4 mg of Narcan, as well as 3 epinephrine doses.   Patient was down for 15 minutes and then CPR for 15 minutes, Ct hjead shows brain edema Will place on hypothermia protocol   SIGNIFICANT EVENTS   Acute cardiac arrest Intubated and starte don Hypothermia protocol   PAST MEDICAL HISTORY    :  Past Medical History  Diagnosis Date  . Hepatitis C   . Drug use   . Coronary artery disease     MI  . Stroke [facility])    Past Surgical History  Procedure Laterality Date  . Hernia repair     Prior to Admission medications   Not on File   Allergies no known allergies   FAMILY HISTORY   Family History  Problem Relation Age of Onset  . Dementia        SOCIAL HISTORY    reports that he uses illicit drugs (IV and Heroin). He reports that he does not drink alcohol. His tobacco history is not on file.  Review of Systems  Unable to perform ROS: critical illness      VITAL SIGNS    Temp:  [92.5 F (33.6 C)-95.4 F (35.2 C)] 92.5 F (33.6 C) (12/22 2330) Pulse Rate:  [88-112] 88 (12/23 0000) Resp:  [7-27] 7 (12/22 2330) BP: (71-160)/(55-112) 148/106 mmHg (12/23 0000) SpO2:  [94 %-99 %] 97 % (12/23 0000) FiO2 (%):  [35 %-100 %] 35 % (12/23 0000) Weight:  [150  lb (68.04 kg)] 150 lb (68.04 kg) (12/22 2014) HEMODYNAMICS:   VENTILATOR SETTINGS: Vent Mode:  [-] AC FiO2 (%):  [35 %-100 %] 35 % Set Rate:  [14 bmp-16 bmp] 14 bmp Vt Set:  [450 mL-500 mL] 450 mL PEEP:  [5 cmH20] 5 cmH20 INTAKE / OUTPUT: No intake or output data in the 24 hours ending 02/27/15 0028     PHYSICAL EXAM   Physical Exam  Constitutional: He appears distressed.  HENT:  Head: Normocephalic and atraumatic.  Eyes: Pupils are equal, round, and reactive to light. No scleral icterus.  Neck: Normal range of motion. Neck supple.  Cardiovascular: Normal rate and regular rhythm.   No murmur heard. Pulmonary/Chest: No respiratory distress. He has no wheezes. He has rales.  resp distress  Abdominal: Soft. He exhibits no distension. There is no tenderness.  Musculoskeletal: He exhibits no edema.  Neurological: He displays normal reflexes. Coordination normal.  gcs<8T  Skin: Skin is warm. No rash noted. He is diaphoretic.       LABS   LABS:  CBC  Recent Labs Lab 02/21/2015 2020  WBC 24.2*  HGB 15.3  HCT 45.8  PLT 232   Coag's No results for input(s): APTT, INR in the last 168 hours. BMET  Recent Labs Lab 02/28/2015 2020  NA 136  K 4.5  CL 102  CO2 16*  BUN 19  CREATININE 1.73*  GLUCOSE 319*   Electrolytes  Recent Labs Lab 02/28/2015 2020  CALCIUM 8.2*   Sepsis Markers  Recent Labs Lab 02/07/2015 2020  LATICACIDVEN 9.0*   ABG  Recent Labs Lab 02/20/2015 2100  PHART 7.41  PCO2ART 20*  PO2ART 273*   Liver Enzymes  Recent Labs Lab 02/07/2015 2020  AST 70*  ALT 69*  ALKPHOS 102  BILITOT 0.8  ALBUMIN 3.8   Cardiac Enzymes  Recent Labs Lab 02/10/2015 2020  TROPONINI 0.41*   Glucose No results for input(s): GLUCAP in the last 168 hours.   No results found for this or any previous visit (from the past 240 hour(s)).   Current facility-administered medications:  .  0.9 %  sodium chloride infusion, 2,000 mL, Intravenous, Once,  Flora Lipps, MD .  artificial tears (LACRILUBE) ophthalmic ointment 1 application, 1 application, Both Eyes, 3 times per day, Flora Lipps, MD .  aspirin suppository 300 mg, 300 mg, Rectal, NOW, Flora Lipps, MD .  fentaNYL (SUBLIMAZE) bolus via infusion 50 mcg, 50 mcg, Intravenous, Q30 min PRN, Flora Lipps, MD .  fentaNYL 2545mg in NS 2547m(1086mml) infusion-PREMIX, 25-300 mcg/hr, Intravenous, Continuous, KurFlora LippsD .  heparin injection 5,000 Units, 5,000 Units, Subcutaneous, 3 times per day, KurFlora LippsD .  midazolam (VERSED) 50 mg in sodium chloride 0.9 % 50 mL (1 mg/mL) infusion, 1-10 mg/hr, Intravenous, Continuous, KurFlora LippsD .  norepinephrine (LEVOPHED) 4 mg in dextrose 5 % 250 mL (0.016 mg/mL) infusion, 0-50 mcg/min, Intravenous, Titrated, KurFlora LippsD .  vecuronium (NORCURON) 100 mg in sodium chloride 0.9 % 200 mL (0.5 mg/mL) infusion, 0.8-1.7 mcg/kg/min, Intravenous, Titrated, KurFlora LippsD .  vecuronium (NORCURON) bolus via infusion 5.4 mg, 0.08 mg/kg, Intravenous, Once, KurFlora LippsD No current outpatient prescriptions on file.  IMAGING    Ct Head Wo Contrast  02/12/2015  CLINICAL DATA:  Witnessed cardiac arrest.  Unresponsive. EXAM: CT HEAD WITHOUT CONTRAST TECHNIQUE: Contiguous axial images were obtained from the base of the skull through the vertex without intravenous contrast. COMPARISON:  12/09/2011 FINDINGS: Skull and Sinuses:No acute or traumatic finding Visualized orbits: Negative. Brain: There is blurred gray-white differentiation at the cortex, without discrete infarct, hemorrhage, hydrocephalus, or herniation. Deep gray nuclei are still distinct. High-density appearance of the major intracranial veins is likely related to brain edema. There is no historical findings to suggest primary venous occlusive disease. These results were called by telephone at the time of interpretation on 02/07/2015 at 10:44 pm to Dr. StaJoni Fearsho verbally acknowledged these  results. 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 Electronically Signed   By: JonMonte FantasiaD.   On: 02/23/2015 22:50   Dg Chest Portable 1 View  02/14/2015  CLINICAL DATA:  EMS called. Patient on floor of bathroom unresponsive. Patient's girlfriend told EMS that patient has been using heroin today. [facility] [place] PEA. Defib x 1 for VTach. EXAM: PORTABLE CHEST 1 VIEW COMPARISON:  05/05/2013 FINDINGS: Lungs are clear. No pleural effusion or gross pneumothorax. Heart, mediastinum hila are unremarkable. Endotracheal tube tip projects 3.5 cm above the carina. Orogastric tube passes below the diaphragm. IMPRESSION: 1. No acute cardiopulmonary disease. 2. Support apparatus is well positioned. Electronically Signed   By: DavLajean ManesD.   On: 02/10/2015 20:52      Indwelling Urinary Catheter continued, requirement due  to   Reason to continue Indwelling Urinary Catheter for strict Intake/Output monitoring for hemodynamic instability   Central Line continued, requirement due to   Reason to continue [employer] Monitoring of central venous pressure or other hemodynamic parameters   Ventilator continued, requirement due to, resp failure    Ventilator Sedation RASS 0 to -2   MAJOR EVENTS/TEST RESULTS:   INDWELLING DEVICES:: RT IJ 12/23>> RT femoral CVl removed 12/23  A line LT femoral 12/23>> MICRO DATA: MRSA PCR  Urine  Blood Resp   ANTIMICROBIALS:     ASSESSMENT/PLAN   38 yo white male with HEP C admitted to ICU for acute cardiac arrest with brain edema from acute Heroin and Cocaine drug abuse leading to PEA and V fib arrest  PULMONARY -Respiratory Failure -continue Full MV support -Wean Fio2 and PEEP as tolerated -will perform SAT/SBt when respiratory parameters are met   CARDIOVASCULAR Will START HYPOTHERMIA PROTOCOL  RENAL ARF from ATN from shock -follow UO, foley  placed  GASTROINTESTINAL NG to suction -keep NPO for now  HEMATOLOGIC Follow CBC  INFECTIOUS No abx needed at this time Cultures per protocol  ENDOCRINE - ICU hypoglycemic\Hyperglycemia protocol   NEUROLOGIC - intubated and sedated -sedation to achieve a RASS goal: -3 to -5 -versed/fentanyl and vec infusions    I have personally obtained a history, examined the patient, evaluated laboratory and independently reviewed  imaging results, formulated the assessment and plan and placed orders.  The Patient requires high complexity decision making for assessment and support, frequent evaluation and titration of therapies, application of advanced monitoring technologies and extensive interpretation of multiple databases. Critical Care Time devoted to patient care services described in this note is 60  minutes.   Overall, patient is critically ill, prognosis is guarded. Patient at high risk for cardiac arrest and death.    Corrin Parker, M.D.  [facility]  Medical Director [place] Director [facility] Cardio-Pulmonary Department

## 2015-02-27 NOTE — Progress Notes (Signed)
Fentanyl drip increased to 25 due to SBP 180s and Versed gtt to 6. Will continue to monitor

## 2015-02-27 NOTE — Consult Note (Signed)
Cardiology Consultation Note  Patient ID: Roy Gordon, MRN: [id number], DOB/AGE: [birth year] 39 y.o. Admit date: 03/05/2015   Date of Consult: 02/27/2015 Primary Physician: No primary care provider on file. Primary Cardiologist: New to [facility]  Chief Complaint: Cardiac arrest Reason for Consult: Cardiac arrest   HPI: 39 y.o. male with h/o IV drug abuse, hepatitis C, depression with prior suicide attempts, reported prior stroke who presented to [facility] on 12/22 s/p cardiac arrest with acute respiratory failure. He was placed on the hypothermia protocol.   He has no previously known cardiac history. HPI is taken from prior notes as patient is intubated and there is no family present in the room. Apparently, the patient was using heroin earlier in the day on 12/22. While at a gas station he suffered a witnessed cardiac arrest. He was coded by EMS for 15 minutes, initially in PEA, followed by Vfib and was defibrillated. This led to ROSC. He received a total of 4 mg of Narcan as well as 3 epi doses. In the field a King airway was placed, however this was changed for an endotracheal tube upon his arrival to [facility]. Upon his arrival to [facility] he was found to be hypotensive in the 70s/30s requiring pressors. He was placed in the hypothermia protocol.   Upon the patient's arrival to [facility] they were found to have a troponin of 31.27-->32.94-->27.11. BNP was 91. Urine drug screen was positive for cocaine and opiates. ECG showed sinus rhythm, 79 bpm, 1 mm lateral ST elevation with reciprocal changes along inferior leads, CXR showed no acute cardiopuomonary process. Head CT showed blurred cortex concerning for global anoxic injury. Ethanol, salicylate, and acetaminophen were negative. On telemetry he has been in sinus rhythm in the 70's. He has had episodes of occasional NSVT with the longest being 14 beats.    Past Medical History  Diagnosis Date  . Hepatitis C   . 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 Coronary  artery disease   . Stroke (HCC)   . Depression     a. prior suicide attempts      Most Recent Cardiac Studies: none   Surgical History:  Past Surgical History  Procedure Laterality Date  . Hernia repair       Home Meds: Prior to Admission medications   Not on File    Inpatient Medications:  . sodium chloride  2,000 mL Intravenous Once  . antiseptic oral rinse  7 mL Mouth Rinse 10 times per day  . artificial tears  1 application Both Eyes 3 times per day  . chlorhexidine gluconate  15 mL Mouth Rinse BID  . pantoprazole (PROTONIX) IV  40 mg Intravenous Daily  . vancomycin  125 mg Per Tube QID   . fentaNYL infusion INTRAVENOUS 150 mcg/hr (02/27/15 1200)  . heparin 550 Units/hr (02/27/15 1200)  . midazolam (VERSED) infusion 4 mg/hr (02/27/15 1206)  . norepinephrine (LEVOPHED) Adult infusion Stopped (02/27/15 0207)  . vecuronium (NORCURON) infusion 0.6 mcg/kg/min (02/27/15 1200)    Allergies: No Known Allergies  Social History   Social History  . Marital Status: Single    Spouse Name: N/A  . Number of Children: N/A  . Years of Education: N/A   Occupational History  . Not on file.   Social History Main Topics  . Smoking status: Unknown If Ever Smoked  . Smokeless tobacco: Not on file  . Alcohol Use: No  . Drug Use: Yes    Special:  IV, Heroin  . Sexual Activity: Not on file   Other Topics Concern  . Not on file   Social History Narrative     Family History  Problem Relation Age of Onset  . Dementia       Review of Systems: Review of Systems  Unable to perform ROS: intubated    Labs:  Recent Labs  02/11/2015 2020 02/27/15 0220 02/27/15 0442 02/27/15 1122  TROPONINI 0.41* 31.27* 32.94* 27.11*   Lab Results  Component Value Date   WBC 21.0* 02/27/2015   HGB 16.7 02/27/2015   HCT 47.4 02/27/2015   MCV 88.6 02/27/2015   PLT 154 02/27/2015    Recent Labs Lab 03/04/2015 2020  02/27/15 0743  NA 136  < > 139  K 4.5  < > 4.0  CL 102  < > 111    CO2 16*  < > 22  BUN 19  < > 19  CREATININE 1.73*  < > 1.48*  CALCIUM 8.2*  < > 8.5*  PROT 6.6  --   --   BILITOT 0.8  --   --   ALKPHOS 102  --   --   ALT 69*  --   --   AST 70*  --   --   GLUCOSE 319*  < > 125*  < > = values in this interval not displayed. No results found for: CHOL, HDL, LDLCALC, TRIG No results found for: DDIMER  Radiology/Studies:  Dg Chest 1 View  02/27/2015  CLINICAL DATA:  Central line placement EXAM: CHEST 1 VIEW COMPARISON:  Yesterday FINDINGS: New right IJ line, tip at the upper cavoatrial junction. There is no pneumothorax or new mediastinal widening. Unchanged positioning of endotracheal and orogastric tubes. There is no edema, consolidation, effusion, or pneumothorax. Normal heart size and mediastinal contours. IMPRESSION: 1. New right IJ line without complicating feature. 2. Lungs remain clear. Electronically Signed   By: Marnee SpringJonathon  Watts M.D.   On: 02/27/2015 01:57   Ct Head Wo Contrast  02/11/2015  CLINICAL DATA:  Witnessed cardiac arrest.  Unresponsive. EXAM: CT HEAD WITHOUT CONTRAST TECHNIQUE: Contiguous axial images were obtained from the base of the skull through the vertex without intravenous contrast. COMPARISON:  12/09/2011 FINDINGS: Skull and Sinuses:No acute or traumatic finding Visualized orbits: Negative. Brain: There is blurred gray-white differentiation at the cortex, without discrete infarct, hemorrhage, hydrocephalus, or herniation. Deep gray nuclei are still distinct. High-density appearance of the major intracranial veins is likely related to brain edema. There is no historical findings to suggest primary venous occlusive disease. These results were called by telephone at the time of interpretation on 02/19/2015 at 10:44 pm to Dr. Scotty CourtStafford, who verbally acknowledged these results. 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 Electronically Signed   By: Marnee SpringJonathon  Watts M.D.   On: 03/07/2015 22:50   Dg Chest Portable 1  View  03/06/2015  CLINICAL DATA:  EMS called. Patient on floor of bathroom unresponsive. Patient's girlfriend told EMS that patient has been using heroin today. [facility] [place] Arrest was witnessed PEA. Defib x 1 for VTach. EXAM: PORTABLE CHEST 1 VIEW COMPARISON:  05/05/2013 FINDINGS: Lungs are clear. No pleural effusion or gross pneumothorax. Heart, mediastinum hila are unremarkable. Endotracheal tube tip projects 3.5 cm above the carina. Orogastric tube passes below the diaphragm. IMPRESSION: 1. No acute cardiopulmonary disease. 2. Support apparatus is well positioned. Electronically Signed   By: Amie Portlandavid  Ormond M.D.   On: 02/18/2015 20:52  EKG: sinus rhythm, 79 bpm, 1 mm lateral ST elevation with reciprocal changes along inferior leads  Weights: Filed Weights   02/22/2015 2014  Weight: 150 lb (68.04 kg)     Physical Exam: Blood pressure 113/85, pulse 67, temperature 87.3 F (30.7 C), temperature source Core (Comment), resp. rate 24, height  (1.778 m), weight 150 lb (68.04 kg), SpO2 100 %. Body mass index is 21.52 kg/(m^2). General: Well developed, well nourished, in no acute distress. Head: Normocephalic, atraumatic, sclera non-icteric, no xanthomas, nares are without discharge.  Neck: Negative for carotid bruits. JVD not elevated. Lungs: Decreased breath sounds bilaterally. Intubated. Heart: RRR with S1 S2. No murmurs, rubs, or gallops appreciated. Abdomen: Soft, non-tender, non-distended with normoactive bowel sounds. No hepatomegaly. No rebound/guarding. No obvious abdominal masses. Msk:  Strength and tone appear normal for age. Extremities: No clubbing or cyanosis. No edema.  Distal pedal pulses are 2+ and equal bilaterally. Neuro: Alert and oriented X 3. No facial asymmetry. No focal deficit. Moves all extremities spontaneously. Psych:  Responds to questions appropriately with a normal affect.    Assessment and Plan:   1. Cardiac arrest  with acute respiratory failure: -In the setting of acute drug overdose with cocaine and possibly heroin  -Troponin peaked at 32 and is trending down, ECG concerning for slight st elevation along lateral leads with reciprocal changes, concerning for lateral MI -Continue to cycle ECG -Patient on heparin gtt, continue for 48 hours  -He has had occasional episodes of NSVT this afternoon, with the longest being 14 beats. He is at increased risk for cardiac arrhythmia  -Should arrhythmias persist or increase he may require an antiarrythmic such as amiodarone gtt -Will discuss ischemic evaluation with interventional cardiology  -Check echo to evaluate LV function and wall motion once able s/p cooling protocol  -He would also be at increased risk of valvular vegetations given his IV drug abuse  -Would be hesitant to initiate beta blocker, even Coreg in this patient even his history of prior suicide attempt   2. NSVT: -Likely in the setting of the above -As above  3. Polysubstance abuse/drug overdose: -Patient has history of suicide attempt in the past upon review [facility] records -Radiation protection practitioner upon him being extubated along with psych referral for further evaluation    Signed, Eula Listen, PA-C Pager: [phone] 02/27/2015, 12:35 PM

## 2015-02-27 NOTE — Consult Note (Signed)
[facility]      Name: Myriam ForehandBryan P Kroon MRN: [id number] DOB: [birth year]    ADMISSION DATE:  05-06-14   CHIEF COMPLAINT:    acute resp failure and cardiac arrest   HISTORY OF PRESENT ILLNESS  39 y.o. male for the past medical history of hepatitis C presents the emergency department after cardiac arrest. According to EMS the patient was at a gas station, with a witnessed cardiac arrest by his girlfriend. Per report Girlfriend states the patient has been using heroin today. Per EMS patient underwent CPR for approximately 15 minutes, initially was PEA  Arrest then  was in ventricular fibrillation and was shocked with return of spontaneous circulation. Patient received a total of 4 mg of Narcan, as well as 3 epinephrine doses.   Patient was down for 15 minutes and then CPR for 15 minutes, Ct hjead shows brain edema Will place on hypothermia protocol   SIGNIFICANT EVENTS   Acute cardiac arrest Intubated and starte don Hypothermia protocol   PAST MEDICAL HISTORY    :  Past Medical History  Diagnosis Date  . Hepatitis C   . 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 Coronary artery disease   . Stroke (HCC)   . Depression     a. prior suicide attempts   Past Surgical History  Procedure Laterality Date  . Hernia repair     Prior to Admission medications   Not on File   No Known Allergies   FAMILY HISTORY   Family History  Problem Relation Age of Onset  . Dementia        SOCIAL HISTORY    reports that he uses illicit drugs (IV and Heroin). He reports that he does not drink alcohol. His tobacco history is not on file.  Review of Systems  Unable to perform ROS: critical illness      VITAL SIGNS    Temp:  [87.3 F (30.7 C)-95.4 F (35.2 C)] 87.3 F (30.7 C) (12/23 1200) Pulse Rate:  [66-112] 67 (12/23 1200) Resp:  [7-27] 24 (12/23 1200) BP: (71-160)/(55-120) 113/85 mmHg (12/23 1200) SpO2:  [94 %-100 %] 100 % (12/23  1200) Arterial Line BP: (104-161)/(77-113) 132/88 mmHg (12/23 1000) FiO2 (%):  [30 %-100 %] 30 % (12/23 1300) Weight:  [68.04 kg (150 lb)] 68.04 kg (150 lb) (12/22 2014) HEMODYNAMICS: CVP:  [8 mmHg-12 mmHg] 9 mmHg VENTILATOR SETTINGS: Vent Mode:  [-] PRVC FiO2 (%):  [30 %-100 %] 30 % Set Rate:  [14 bmp-24 bmp] 24 bmp Vt Set:  [450 mL-500 mL] 450 mL PEEP:  [5 cmH20] 5 cmH20 INTAKE / OUTPUT:  Intake/Output Summary (Last 24 hours) at 02/27/15 1416 Last data filed at 02/27/15 1200  Gross per 24 hour  Intake 274.65 ml  Output   1805 ml  Net -1530.35 ml       PHYSICAL EXAM   Physical Exam  Constitutional: He appears well-developed and well-nourished.  HENT:  Head: Normocephalic and atraumatic.  Eyes: Pupils are equal, round, and reactive to light. No scleral icterus.  Neck: No JVD present.  Cardiovascular: Normal rate and regular rhythm.   No murmur heard. Pulmonary/Chest: He has no wheezes. He has no rales.  resp distress  Abdominal: Soft. He exhibits no distension. There is no tenderness.  Musculoskeletal: He exhibits no edema.  Lymphadenopathy:    He has no cervical adenopathy.  Neurological:  Sedated, paralyzed  Skin: No rash noted.  LABS   LABS:  CBC  Recent Labs Lab 03-12-15 2020 02/27/15 0220 02/27/15 0442  WBC 24.2* 23.0* 21.0*  HGB 15.3 16.5 16.7  HCT 45.8 49.0 47.4  PLT 232 162 154   Coag's  Recent Labs Lab 02/27/15 0220 02/27/15 0743  APTT 32 75*  INR 1.19 1.19   BMET  Recent Labs Lab 02/27/15 0601 02/27/15 0743 02/27/15 1150  NA 138 139 138  K 3.9 4.0 4.0  CL 113* 111 113*  CO2 20* 22 17*  BUN CREATININE 1.23 1.48* 1.28*  GLUCOSE 126* 125* 105*   Electrolytes  Recent Labs Lab 02/27/15 0601 02/27/15 0743 02/27/15 1150  CALCIUM 8.2* 8.5* 8.5*   Sepsis Markers  Recent Labs Lab Mar 12, 2015 2020 02/27/15 0226  LATICACIDVEN 9.0* 1.4   ABG  Recent Labs Lab 02/27/15 0245 02/27/15 0800  02/27/15 1240  PHART 7.28* 7.37 7.37  PCO2ART 41 29* 27*  PO2ART 182* 181* 173*   Liver Enzymes  Recent Labs Lab Mar 12, 2015 2020  AST 70*  ALT 69*  ALKPHOS 102  BILITOT 0.8  ALBUMIN 3.8   Cardiac Enzymes  Recent Labs Lab 02/27/15 0220 02/27/15 0442 02/27/15 1122  TROPONINI 31.27* 32.94* 27.11*   Glucose  Recent Labs Lab 02/27/15 0652 02/27/15 0757 02/27/15 0912 02/27/15 1000 02/27/15 1102 02/27/15 1156  GLUCAP 122* 107* 98 91 104* 94     Recent Results (from the past 240 hour(s))  C difficile quick scan w PCR reflex     Status: Abnormal   Collection Time: 02/27/15  2:26 AM  Result Value Ref Range Status   C Diff antigen POSITIVE (A) NEGATIVE Final   C Diff toxin NEGATIVE NEGATIVE Final   C Diff interpretation   Final    Positive for toxigenic C. difficile, active toxin production not detected. Patient has toxigenic C. difficile organisms present in the bowel, but toxin was not detected. The patient may be a carrier or the level of toxin in the sample was below the limit  of detection. This information should be used in conjunction with the patient's clinical history when deciding on possible therapy.   MRSA PCR Screening     Status: None   Collection Time: 02/27/15  2:26 AM  Result Value Ref Range Status   MRSA by PCR NEGATIVE NEGATIVE Final    Comment:        The GeneXpert MRSA Assay (FDA approved for NASAL specimens only), is one component of a comprehensive MRSA colonization surveillance program. It is not intended to diagnose MRSA infection nor to guide or monitor treatment for MRSA infections.   Clostridium Difficile by PCR     Status: Abnormal   Collection Time: 02/27/15  2:26 AM  Result Value Ref Range Status   Toxigenic C Difficile by pcr POSITIVE (A) NEGATIVE Final    Comment: CRITICAL RESULT CALLED TO, READ BACK BY AND VERIFIED WITH: MONIQUE ROBERSON 02/27/15 0431 SJL      Current facility-administered medications:  .  0.9 %  sodium  chloride infusion, 2,000 mL, Intravenous, Once, Erin Fulling, MD, 2,000 mL at 02/27/15 0206 .  antiseptic oral rinse solution (CORINZ), 7 mL, Mouth Rinse, 10 times per day, Oralia Manis, MD, 7 mL at 02/27/15 1206 .  artificial tears (LACRILUBE) ophthalmic ointment 1 application, 1 application, Both Eyes, 3 times per day, Erin Fulling, MD, 1 application at 02/27/15 0543 .  aspirin tablet 325 mg, 325 mg, Per Tube, Daily, Merwyn Katos, MD .  chlorhexidine  gluconate (PERIDEX) 0.12 % solution 15 mL, 15 mL, Mouth Rinse, BID, Oralia Manis, MD, 15 mL at 02/27/15 0745 .  fentaNYL in NS (15mcg/ml) infusion-PREMIX, 25-300 mcg/hr, Intravenous, Continuous, Erin Fulling, MD, Last Rate: 15 mL/hr at 02/27/15 1200, 150 mcg/hr at 02/27/15 1200 .  heparin ADULT infusion 100 units/mL (25000 units/250 mL), 550 Units/hr, Intravenous, Continuous, Erin Fulling, MD, Last Rate: 5.5 mL/hr at 02/27/15 1200, 550 Units/hr at 02/27/15 1200 .  midazolam (VERSED) 50 mg in sodium chloride 0.9 % 50 mL (1 mg/mL) infusion, 1-10 mg/hr, Intravenous, Continuous, Erin Fulling, MD, Last Rate: 4 mL/hr at 02/27/15 1206, 4 mg/hr at 02/27/15 1206 .  norepinephrine (LEVOPHED) 4 mg in dextrose 5 % 250 mL (0.016 mg/mL) infusion, 0-50 mcg/min, Intravenous, Titrated, Erin Fulling, MD, Stopped at 02/27/15 0207 .  pantoprazole (PROTONIX) injection 40 mg, 40 mg, Intravenous, Daily, Oralia Manis, MD, 40 mg at 02/27/15 1013 .  vancomycin (VANCOCIN) 50 mg/mL oral solution 125 mg, 125 mg, Per Tube, QID, Merwyn Katos, MD, 125 mg at 02/27/15 1119 .  vecuronium (NORCURON) 100 mg in sodium chloride 0.9 % 200 mL (0.5 mg/mL) infusion, 0.8-1.7 mcg/kg/min, Intravenous, Titrated, Erin Fulling, MD, Last Rate: 4.9 mL/hr at 02/27/15 1200, 0.6 mcg/kg/min at 02/27/15 1200  IMAGING    Dg Chest 1 View  02/27/2015  CLINICAL DATA:  Central line placement EXAM: CHEST 1 VIEW COMPARISON:  Yesterday FINDINGS: New right IJ line, tip at the upper cavoatrial  junction. There is no pneumothorax or new mediastinal widening. Unchanged positioning of endotracheal and orogastric tubes. There is no edema, consolidation, effusion, or pneumothorax. Normal heart size and mediastinal contours. IMPRESSION: 1. New right IJ line without complicating feature. 2. Lungs remain clear. Electronically Signed   By: Marnee Spring M.D.   On: 02/27/2015 01:57   Ct Head Wo Contrast  02/13/2015  CLINICAL DATA:  Witnessed cardiac arrest.  Unresponsive. EXAM: CT HEAD WITHOUT CONTRAST TECHNIQUE: Contiguous axial images were obtained from the base of the skull through the vertex without intravenous contrast. COMPARISON:  12/09/2011 FINDINGS: Skull and Sinuses:No acute or traumatic finding Visualized orbits: Negative. Brain: There is blurred gray-white differentiation at the cortex, without discrete infarct, hemorrhage, hydrocephalus, or herniation. Deep gray nuclei are still distinct. High-density appearance of the major intracranial veins is likely related to brain edema. There is no historical findings to suggest primary venous occlusive disease. These results were called by telephone at the time of interpretation on 02/11/2015 at 10:44 pm to Dr. Scotty Court, who verbally acknowledged these results. 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 Electronically Signed   By: Marnee Spring M.D.   On: 02/27/2015 22:50   Dg Chest Portable 1 View  02/15/2015  CLINICAL DATA:  EMS called. Patient on floor of bathroom unresponsive. Patient's girlfriend told EMS that patient has been using heroin today. Patient was found at [facility] in the [place]. Arrest was witnessed PEA. Defib x 1 for VTach. EXAM: PORTABLE CHEST 1 VIEW COMPARISON:  05/05/2013 FINDINGS: Lungs are clear. No pleural effusion or gross pneumothorax. Heart, mediastinum hila are unremarkable. Endotracheal tube tip projects 3.5 cm above the carina. Orogastric tube passes below the diaphragm. IMPRESSION: 1. No acute  cardiopulmonary disease. 2. Support apparatus is well positioned. Electronically Signed   By: Amie Portland M.D.   On: 03/04/2015 20:52      Indwelling Urinary Catheter continued, requirement due to   Reason to continue Indwelling Urinary Catheter for strict Intake/Output monitoring for hemodynamic instability   Central  Line continued, requirement due to   Reason to continue [employer] of central venous pressure or other hemodynamic parameters   Ventilator continued, requirement due to, resp failure    Ventilator Sedation    MAJOR EVENTS/TEST RESULTS: Cardiac markers positive - Trop I 32, 27  INDWELLING DEVICES:: RT IJ 12/23>> RT femoral CVl removed 12/23 L femoral A line 12/23>>  MICRO DATA: MRSA PCR  Urine  Blood Resp  C diff PCR 12/23 >> POS  ANTIMICROBIALS:  Enteral vancomycin 12/23 >>    ASSESSMENT/PLAN   39 yo white male with HEP C admitted to ICU for acute cardiac arrest with brain edema from acute Heroin and Cocaine drug abuse leading to PEA and V fib arrest  PULMONARY VDRF post arrest Cont full vent support - settings reviewed and/or adjusted Cont vent bundle Daily SBT if/when meets criteria   CARDIOVASCULAR Cardiac arrest Elevated cardiac markers c/w AMI Shock, resolved  Recheck EKG Cardiology consult 12/23 Will need ecchoardiogram MAP goal > 80 mmHg while cooled, > 65 after rewarmed   RENAL AKI Oliguria  Monitor BMET intermittently Monitor I/Os Correct electrolytes as indicated Check bladder scan  GASTROINTESTINAL No issues  SUP: IV PPI No nutrition until after rewarmed  HEMATOLOGIC No issues  DVT px: Full dose heparin Monitor CBC intermittently Transfuse per usual guidelines   INFECTIOUS C diff colitis  Monitor temp, WBC count Micro and abx as above   ENDOCRINE Risk of glycemic dysregulation while hypothermic  Monitor CBGs per protocol  NEUROLOGIC Post anoxic encephalopathy Early cerebral edema  post arrest - this portends a poor prognosis  Complete hypothermia protocol Will need further neuro prognostication after rewarmed   CCM time: 45 mins The above time includes time spent in consultation with patient and/or family members and reviewing care plan on multidisciplinary rounds  Billy Fischer, MD PCCM service Mobile [phone] Pager [phone]

## 2015-02-27 NOTE — Progress Notes (Signed)
Catheter irrigated and urine returned. < 50 ml urine noted on bladder scan

## 2015-02-27 NOTE — Care Management (Signed)
Witnessed arrest.  In icu on full ventilator support. There is concern that current state could be related to heroin overdose.  Code ICE  There are no family members present.

## 2015-02-27 NOTE — Procedures (Signed)
Arterial Line Placement: Indication: Frequent blood draws; Invasive BP monitoring.   Consent: Emergent.  Hand washing performed prior to starting the procedure.   Procedure: An active timeout was performed and correct patient, name, & ID confirmed. Physicial exam was performed to ensure adequate perfusion.  Using sterile technique, an aterial line was inserted into the Left Femoral artery artery. Sutured in place and dressing applied.   Catheter threaded and the needle was removed with appropriate blood return.  Arterial waveform was noted.  After the procedure, the patient's extremities were observed to be pink and warm.   BIOPATCH applied  US guidance was used  Estimated Blood Loss: None .   Number of Attempts: 1.   Complications: None .  Operator: Gage Weant.   Lucie LeatherKurian David Marvene Strohm, M.D.  [facility]  Medical Director [facility] Medical Director [facility]

## 2015-02-27 NOTE — Progress Notes (Signed)
[facility] [place] [facility]                                                                                                                                                                                            Patient Demographics   Roy Gordon, is a 39 y.o. male, DOB - June 14, 1975, ZOX:[id number]  Admit date - 09-29-14   Admitting Physician Oralia Manisavid Willis, MD  Outpatient Primary MD for the patient is No primary care provider on file.   LOS - 1  Subjective: Patient remains to be intubated and sedated. Currently on on code ice. As part of the code ice is on sedation so unable to assess his mental status     Review of Systems:   CONSTITUTIONAL: Unable to provide due him being intubated    Vitals:   Filed Vitals:   02/27/15 1000 02/27/15 1100 02/27/15 1200 02/27/15 1300  BP: 121/95 127/95 113/85   Pulse: 69 66 67   Temp: 88 F (31.1 C) 88 F (31.1 C) 87.3 F (30.7 C)   TempSrc:  Core (Comment) Core (Comment) Core (Comment)  Resp: 24 24 24    Height:      Weight:      SpO2: 100% 100% 100%     Wt Readings from Last 3 Encounters:  2014-12-15 68.04 kg (150 lb)     Intake/Output Summary (Last 24 hours) at 02/27/15 1422 Last data filed at 02/27/15 1200  Gross per 24 hour  Intake 274.65 ml  Output   1805 ml  Net -1530.35 ml    Physical Exam:   GENERAL: Critically ill HEAD, EYES, EARS, NOSE AND THROAT: Atraumatic, normocephalic. t. Pupils equal and reactive to light. Sclerae anicteric. No conjunctival injection. No oro-pharyngeal erythema.  NECK: Supple. There is no jugular venous distention. No bruits, no lymphadenopathy, no thyromegaly.  HEART: Regular rate and rhythm,. No murmurs, no rubs, no clicks.  LUNGS: Clear to auscultation bilaterally. No rales or rhonchi. No wheezes.  ABDOMEN: Soft, flat, nontender, nondistended. Has good bowel sounds. No hepatosplenomegaly appreciated.  EXTREMITIES: No evidence of any cyanosis, clubbing, or  peripheral edema.  +2 pedal and radial pulses bilaterally.  NEUROLOGIC: Poorly responsive on the ventilator  SKIN: Moist and warm with no rashes appreciated.  Psych: Intubated LN: No inguinal LN enlargement    Antibiotics   Anti-infectives    Start     Dose/Rate Route Frequency Ordered Stop   02/27/15 1100  vancomycin (VANCOCIN) 50 mg/mL oral solution 125 mg     125 mg Per Tube 4 times daily 02/27/15 1057 03/13/15 0959  Medications   Scheduled Meds: . sodium chloride  2,000 mL Intravenous Once  . antiseptic oral rinse  7 mL Mouth Rinse 10 times per day  . artificial tears  1 application Both Eyes 3 times per day  . aspirin  325 mg Per Tube Daily  . chlorhexidine gluconate  15 mL Mouth Rinse BID  . pantoprazole (PROTONIX) IV  40 mg Intravenous Daily  . vancomycin  125 mg Per Tube QID   Continuous Infusions: . fentaNYL infusion INTRAVENOUS 150 mcg/hr (02/27/15 1200)  . heparin 550 Units/hr (02/27/15 1200)  . midazolam (VERSED) infusion 4 mg/hr (02/27/15 1206)  . norepinephrine (LEVOPHED) Adult infusion Stopped (02/27/15 0207)  . vecuronium (NORCURON) infusion 0.6 mcg/kg/min (02/27/15 1200)   PRN Meds:.   Data Review:   Micro Results Recent Results (from the past 240 hour(s))  C difficile quick scan w PCR reflex     Status: Abnormal   Collection Time: 02/27/15  2:26 AM  Result Value Ref Range Status   C Diff antigen POSITIVE (A) NEGATIVE Final   C Diff toxin NEGATIVE NEGATIVE Final   C Diff interpretation   Final    Positive for toxigenic C. difficile, active toxin production not detected. Patient has toxigenic C. difficile organisms present in the bowel, but toxin was not detected. The patient may be a carrier or the level of toxin in the sample was below the limit  of detection. This information should be used in conjunction with the patient's clinical history when deciding on possible therapy.   MRSA PCR Screening     Status: None   Collection Time: 02/27/15   2:26 AM  Result Value Ref Range Status   MRSA by PCR NEGATIVE NEGATIVE Final    Comment:        The GeneXpert MRSA Assay (FDA approved for NASAL specimens only), is one component of a comprehensive MRSA colonization surveillance program. It is not intended to diagnose MRSA infection nor to guide or monitor treatment for MRSA infections.   Clostridium Difficile by PCR     Status: Abnormal   Collection Time: 02/27/15  2:26 AM  Result Value Ref Range Status   Toxigenic C Difficile by pcr POSITIVE (A) NEGATIVE Final    Comment: CRITICAL RESULT CALLED TO, READ BACK BY AND VERIFIED WITH: MONIQUE ROBERSON 02/27/15 0431 SJL     Radiology Reports Dg Chest 1 View  02/27/2015  CLINICAL DATA:  Central line placement EXAM: CHEST 1 VIEW COMPARISON:  Yesterday FINDINGS: New right IJ line, tip at the upper cavoatrial junction. There is no pneumothorax or new mediastinal widening. Unchanged positioning of endotracheal and orogastric tubes. There is no edema, consolidation, effusion, or pneumothorax. Normal heart size and mediastinal contours. IMPRESSION: 1. New right IJ line without complicating feature. 2. Lungs remain clear. Electronically Signed   By: Marnee Spring M.D.   On: 02/27/2015 01:57   Ct Head Wo Contrast  Feb 28, 2015  CLINICAL DATA:  Witnessed cardiac arrest.  Unresponsive. EXAM: CT HEAD WITHOUT CONTRAST TECHNIQUE: Contiguous axial images were obtained from the base of the skull through the vertex without intravenous contrast. COMPARISON:  12/09/2011 FINDINGS: Skull and Sinuses:No acute or traumatic finding Visualized orbits: Negative. Brain: There is blurred gray-white differentiation at the cortex, without discrete infarct, hemorrhage, hydrocephalus, or herniation. Deep gray nuclei are still distinct. High-density appearance of the major intracranial veins is likely related to brain edema. There is no historical findings to suggest primary venous occlusive disease. These results were  called by telephone  at the time of interpretation on 02-27-15 at 10:44 pm to Dr. Scotty Court, who verbally acknowledged these results. 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 Electronically Signed   By: Marnee Spring M.D.   On: Feb 27, 2015 22:50   Dg Chest Portable 1 View  02/27/15  CLINICAL DATA:  EMS called. Patient on floor of bathroom unresponsive. Patient's girlfriend told EMS that patient has been using heroin today. Patient was found at [facility] in the [place]. Arrest was witnessed PEA. Defib x 1 for VTach. EXAM: PORTABLE CHEST 1 VIEW COMPARISON:  05/05/2013 FINDINGS: Lungs are clear. No pleural effusion or gross pneumothorax. Heart, mediastinum hila are unremarkable. Endotracheal tube tip projects 3.5 cm above the carina. Orogastric tube passes below the diaphragm. IMPRESSION: 1. No acute cardiopulmonary disease. 2. Support apparatus is well positioned. Electronically Signed   By: Amie Portland M.D.   On: 02-27-15 20:52     CBC  Recent Labs Lab February 27, 2015 2020 02/27/15 0220 02/27/15 0442  WBC 24.2* 23.0* 21.0*  HGB 15.3 16.5 16.7  HCT 45.8 49.0 47.4  PLT 232 162 154  MCV 91.1 87.7 88.6  MCH 30.4 29.6 31.3  MCHC 33.3 33.7 35.3  RDW 13.6 13.3 13.1  LYMPHSABS 7.9*  --   --   MONOABS 0.7  --   --   EOSABS 0.2  --   --   BASOSABS 0.1  --   --     Chemistries   Recent Labs Lab 02-27-2015 2020 02/27/15 0220 02/27/15 0601 02/27/15 0743 02/27/15 1150  NA 136 139 138 139 138  K 4.5 3.6 3.9 4.0 4.0  CL 102 110 113* 111 113*  CO2 16* 22 20* 22 17*  GLUCOSE 319* 159* 126* 125* 105*  BUN CREATININE 1.73* 1.11 1.23 1.48* 1.28*  CALCIUM 8.2* 8.1* 8.2* 8.5* 8.5*  AST 70*  --   --   --   --   ALT 69*  --   --   --   --   ALKPHOS 102  --   --   --   --   BILITOT 0.8  --   --   --   --    ------------------------------------------------------------------------------------------------------------------ estimated creatinine clearance is  74.5 mL/min (by C-G formula based on Cr of 1.28). ------------------------------------------------------------------------------------------------------------------ No results for input(s): HGBA1C in the last 72 hours. ------------------------------------------------------------------------------------------------------------------ No results for input(s): CHOL, HDL, LDLCALC, TRIG, CHOLHDL, LDLDIRECT in the last 72 hours. ------------------------------------------------------------------------------------------------------------------ No results for input(s): TSH, T4TOTAL, T3FREE, THYROIDAB in the last 72 hours.  Invalid input(s): FREET3 ------------------------------------------------------------------------------------------------------------------ No results for input(s): VITAMINB12, FOLATE, FERRITIN, TIBC, IRON, RETICCTPCT in the last 72 hours.  Coagulation profile  Recent Labs Lab 02/27/15 0220 02/27/15 0743  INR 1.19 1.19    No results for input(s): DDIMER in the last 72 hours.  Cardiac Enzymes  Recent Labs Lab 02/27/15 0220 02/27/15 0442 02/27/15 1122  TROPONINI 31.27* 32.94* 27.11*   ------------------------------------------------------------------------------------------------------------------ Invalid input(s): POCBNP    Assessment & Plan   Principal Problem:  Cardiac arrest (HCC) - secondary to drug overdose. Continue supportive care , will have to assess his mental status once code Isis finished and he'll be off all sedations. Appreciate pulmonary input  Drug overdose - tox screen positive for opiates and PCP.  Substance abuse counseling if he survives this incident.  Acute respiratory failure with hypoxia (HCC) - intubated, stable with mechanical ventilation at this time. Continue to monitor.  Acute renal insufficiency provide IV fluids  monitor renal function Elevated troponin suspect due to cardiac arrest we'll check a echocardiogram continue heparin  drip cardiology consult will be placed     Code Status Orders        Start     Ordered   02/27/15 0201  Full code   Continuous     02/27/15 0200           Consults  pulmonary critical care   DVT Prophylaxis  Lovenox - Heparin - SCDs  Lab Results  Component Value Date   PLT 154 02/27/2015     Time Spent in minutes 35 minutes of critical care time spent   Auburn Bilberry M.D on 02/27/2015 at 2:22 PM  Between 7am to 6pm - Pager - [phone]  After 6pm go to [email or web address] - password [email or web address] [facility]  [facility]   Office  [phone]

## 2015-02-27 NOTE — Progress Notes (Signed)
I notified Dr. Sung AmabileSimonds of troponin of 27.11 and off urine output of 30 ml. He osuggested bladder scan and irrigation of catheter to make sure it is functioning properly

## 2015-02-27 NOTE — Progress Notes (Signed)
ANTICOAGULATION CONSULT NOTE - Initial Consult  Pharmacy Consult for heparin Indication: chest pain/ACS  No Known Allergies  Patient Measurements: Height: 5\' 10"  (177.8 cm) Weight: 150 lb (68.04 kg) IBW/kg (Calculated) : 73 Heparin Dosing Weight: 68 kg  Vital Signs: Temp: 88.7 F (31.5 C) (12/23 0500) Temp Source: Other (Comment) (12/22 2259) BP: 114/91 mmHg (12/23 0500) Pulse Rate: 76 (12/23 0500)  Labs:  Recent Labs  03/02/2015 2020 02/27/15 0220 02/27/15 0442  HGB 15.3 16.5 16.7  HCT 45.8 49.0 47.4  PLT 232 162 154  APTT  --  32  --   LABPROT  --  15.3*  --   INR  --  1.19  --   CREATININE 1.73* 1.11  --   TROPONINI 0.41* 31.27* 32.94*    Estimated Creatinine Clearance: 85.9 mL/min (by C-G formula based on Cr of 1.11).   Medical History: Past Medical History  Diagnosis Date  . Hepatitis C   . Drug use   . Coronary artery disease     MI  . Stroke [facility](HCC)     Medications:  Infusions:  . fentaNYL infusion INTRAVENOUS 150 mcg/hr (02/27/15 0400)  . midazolam (VERSED) infusion 4 mg/hr (02/27/15 0400)  . norepinephrine (LEVOPHED) Adult infusion Stopped (02/27/15 0207)  . vecuronium (NORCURON) infusion 0.6 mcg/kg/min (02/27/15 0415)    Assessment: 39 yom cc ACS post drug over dose. Currently on hypothermia protocol and has received 5000 units subcutaneous x 2 doses.   Goal of Therapy:  Heparin level 0.3-0.7 units/ml Monitor platelets by anticoagulation protocol: Yes   Plan:  Start heparin infusion at 550 units/hr Check anti-Xa level in 6 hours and daily while on heparin Continue to monitor H&H and platelets  Carola FrostNathan A Antwaun Buth, Pharm.D., BCPS Clinical Pharmacist 02/27/2015,5:53 AM

## 2015-02-27 NOTE — Procedures (Signed)
Central Venous Catheter Placement: Indication: Patient receiving vesicant or irritant drug.; Patient receiving intravenous therapy for longer than 5 days.; Patient has limited or no vascular access.   Consent:emergent   Hand washing performed prior to starting the procedure.   Procedure: An active timeout was performed and correct patient, name, & ID confirmed.  After explaining risk and benefits, patient was positioned correctly for central venous access. Patient was prepped using strict sterile technique including chlorohexadine preps, sterile drape, sterile gown and sterile gloves.  The area was prepped, draped and anesthetized in the usual sterile manner. Patient comfort was obtained.  A triple lumen catheter was placed in RT  Internal Jugular Vein There was good blood return, catheter caps were placed on lumens, catheter flushed easily, the line was secured and a sterile dressing and BIO-PATCH applied.   Ultrasound was used to visualize vasculature and guidance of needle.   Number of Attempts: 1 Complications:none Estimated Blood Loss: none Chest Radiograph indicated and ordered.  Operator: Gregori Abril.   Roy Gordon, M.D.  [facility]  Medical Director ICU-[facility]  Medical Director [facility] Cardio-Pulmonary Department     

## 2015-02-27 NOTE — Progress Notes (Signed)
eLink Physician-Brief Progress Note Patient Name: Roy ForehandBryan P Gordon DOB: 11-03-1975 MRN: [id number]   Date of Service  02/27/2015  HPI/Events of Note  Morning ECG reviewed. Note troponin positive post-arrest. Will start empiric heparin, follow trop for plateau   eICU Interventions       Intervention Category Intermediate Interventions: Other:  Roisin Mones S. 02/27/2015, 5:46 AM

## 2015-02-27 NOTE — Consult Note (Signed)
1.RT femoral CVL placed emergently in ER-will remove ASAP 2.RT IJ CVL placed 3.LEFT FEMORAL ART LINE PLACED

## 2015-02-28 ENCOUNTER — Inpatient Hospital Stay: Payer: Medicaid Other

## 2015-02-28 ENCOUNTER — Inpatient Hospital Stay (HOSPITAL_COMMUNITY)
Admit: 2015-02-28 | Discharge: 2015-02-28 | Disposition: A | Discharge status: Home / Self Care | Payer: Medicaid Other | Attending: Physician Assistant | Admitting: Physician Assistant

## 2015-02-28 DIAGNOSIS — I469 Cardiac arrest, cause unspecified: Secondary | ICD-10-CM

## 2015-02-28 DIAGNOSIS — G931 Anoxic brain damage, not elsewhere classified: Secondary | ICD-10-CM

## 2015-02-28 DIAGNOSIS — R569 Unspecified convulsions: Secondary | ICD-10-CM

## 2015-02-28 LAB — BLOOD GAS, ARTERIAL
ACID-BASE DEFICIT: 10 mmol/L — AB (ref 0.0–2.0)
ACID-BASE DEFICIT: 8.6 mmol/L — AB (ref 0.0–2.0)
ACID-BASE DEFICIT: 6.2 mmol/L — AB (ref 0.0–2.0)
ALLENS TEST (PASS/FAIL): POSITIVE — AB
ALLENS TEST (PASS/FAIL): POSITIVE — AB
ALLENS TEST (PASS/FAIL): POSITIVE — AB
Acid-Base Excess: 7.8 mmol/L — ABNORMAL HIGH (ref 0.0–3.0)
Acid-base deficit: 7.8 mmol/L — ABNORMAL HIGH (ref 0.0–2.0)
BICARBONATE: 13.9 meq/L — AB (ref 21.0–28.0)
Bicarbonate: 15.6 mEq/L — ABNORMAL LOW (ref 21.0–28.0)
Bicarbonate: 15.6 mEq/L — ABNORMAL LOW (ref 21.0–28.0)
Bicarbonate: 14.6 mEq/L — ABNORMAL LOW (ref 21.0–28.0)
Bicarbonate: 18.1 mEq/L — ABNORMAL LOW (ref 21.0–28.0)
FIO2: 0.3
FIO2: 0.3
FIO2: 0.3
FIO2: 0.3
FIO2: 0.3
MECHVT: 450 mL
MECHVT: 450 mL
O2 SAT: 99.5 %
O2 SAT: 99.7 %
O2 SAT: 99.7 %
O2 SAT: 99.2 %
O2 Saturation: 99.5 %
PATIENT TEMPERATURE: 31.7
PATIENT TEMPERATURE: 37
PATIENT TEMPERATURE: 35.2
PCO2 ART: 27 mmHg — AB (ref 32.0–48.0)
PCO2 ART: 29 mmHg — AB (ref 32.0–48.0)
PCO2 ART: 30 mmHg — AB (ref 32.0–48.0)
PEEP/CPAP: 5 cmH2O
PEEP/CPAP: 5 cmH2O
PEEP: 5 cmH2O
PEEP: 5 cmH2O
PH ART: 7.37 (ref 7.350–7.450)
PH ART: 7.37 (ref 7.350–7.450)
PH ART: 7.31 — AB (ref 7.350–7.450)
PH ART: 7.41 (ref 7.350–7.450)
PH ART: 7.39 (ref 7.350–7.450)
PO2 ART: 173 mmHg — AB (ref 83.0–108.0)
PO2 ART: 183 mmHg — AB (ref 83.0–108.0)
PO2 ART: 203 mmHg — AB (ref 83.0–108.0)
PO2 ART: 135 mmHg — AB (ref 83.0–108.0)
Patient temperature: 37
RATE: 24 resp/min
RATE: 24 resp/min
RATE: 24 resp/min
VT: 450 mL
VT: 450 mL
pCO2 arterial: 27 mmHg — ABNORMAL LOW (ref 32.0–48.0)
pCO2 arterial: 22 mmHg — ABNORMAL LOW (ref 32.0–48.0)
pO2, Arterial: 173 mmHg — ABNORMAL HIGH (ref 83.0–108.0)

## 2015-02-28 LAB — GLUCOSE, CAPILLARY
GLUCOSE-CAPILLARY: 104 mg/dL — AB (ref 65–99)
GLUCOSE-CAPILLARY: 77 mg/dL (ref 65–99)
GLUCOSE-CAPILLARY: 98 mg/dL (ref 65–99)
GLUCOSE-CAPILLARY: 109 mg/dL — AB (ref 65–99)
Glucose-Capillary: 171 mg/dL — ABNORMAL HIGH (ref 65–99)

## 2015-02-28 LAB — COMPREHENSIVE METABOLIC PANEL
ALT: 94 U/L — ABNORMAL HIGH (ref 17–63)
ANION GAP: 9 (ref 5–15)
AST: 140 U/L — AB (ref 15–41)
Albumin: 3.5 g/dL (ref 3.5–5.0)
Alkaline Phosphatase: 68 U/L (ref 38–126)
BUN: 15 mg/dL (ref 6–20)
CHLORIDE: 112 mmol/L — AB (ref 101–111)
CO2: 19 mmol/L — ABNORMAL LOW (ref 22–32)
Calcium: 8.4 mg/dL — ABNORMAL LOW (ref 8.9–10.3)
Creatinine, Ser: 0.91 mg/dL (ref 0.61–1.24)
Glucose, Bld: 95 mg/dL (ref 65–99)
POTASSIUM: 3.6 mmol/L (ref 3.5–5.1)
Sodium: 140 mmol/L (ref 135–145)
Total Bilirubin: 1 mg/dL (ref 0.3–1.2)
Total Protein: 6.3 g/dL — ABNORMAL LOW (ref 6.5–8.1)

## 2015-02-28 LAB — CBC
HCT: 45.2 % (ref 40.0–52.0)
Hemoglobin: 15.5 g/dL (ref 13.0–18.0)
MCH: 30.6 pg (ref 26.0–34.0)
MCHC: 34.4 g/dL (ref 32.0–36.0)
MCV: 89.1 fL (ref 80.0–100.0)
PLATELETS: 142 10*3/uL — AB (ref 150–440)
RBC: 5.08 MIL/uL (ref 4.40–5.90)
RDW: 13.3 % (ref 11.5–14.5)
WBC: 15.8 10*3/uL — AB (ref 3.8–10.6)

## 2015-02-28 LAB — BASIC METABOLIC PANEL
ANION GAP: 7 (ref 5–15)
BUN: 16 mg/dL (ref 6–20)
CHLORIDE: 113 mmol/L — AB (ref 101–111)
CO2: 20 mmol/L — AB (ref 22–32)
Calcium: 8.5 mg/dL — ABNORMAL LOW (ref 8.9–10.3)
Creatinine, Ser: 0.8 mg/dL (ref 0.61–1.24)
GFR calc non Af Amer: >60 mL/min (ref >60)
GLUCOSE: 92 mg/dL (ref 65–99)
Potassium: 3.7 mmol/L (ref 3.5–5.1)
Sodium: 140 mmol/L (ref 135–145)

## 2015-02-28 LAB — HEPARIN LEVEL (UNFRACTIONATED)
Heparin Unfractionated: 0.71 IU/mL — ABNORMAL HIGH (ref 0.30–0.70)
Heparin Unfractionated: 0.21 IU/mL — ABNORMAL LOW (ref 0.30–0.70)
Heparin Unfractionated: 0.11 IU/mL — ABNORMAL LOW (ref 0.30–0.70)

## 2015-02-28 LAB — TRIGLYCERIDES: TRIGLYCERIDES: 133 mg/dL (ref <150)

## 2015-02-28 MED ORDER — SODIUM CHLORIDE 0.9 % IV SOLN
1000.0000 mg | Freq: Two times a day (BID) | INTRAVENOUS | Status: DC
  Administered 2015-02-28 – 2015-03-02 (×5): 1000 mg via INTRAVENOUS
  Filled 2015-02-28 (×6): qty 10

## 2015-02-28 MED ORDER — FENTANYL CITRATE (PF) 100 MCG/2ML IJ SOLN: 25.0000 ug | INTRAMUSCULAR | Status: DC | PRN

## 2015-02-28 MED ORDER — NOREPINEPHRINE BITARTRATE 1 MG/ML IV SOLN
0.0000 ug/min | INTRAVENOUS | Status: DC
  Administered 2015-03-01: 40 ug/min via INTRAVENOUS
  Administered 2015-03-02: 7 ug/min via INTRAVENOUS
  Filled 2015-02-28 (×2): qty 16

## 2015-02-28 MED ORDER — PROPOFOL 1000 MG/100ML IV EMUL
5.0000 ug/kg/min | INTRAVENOUS | Status: DC
  Administered 2015-02-28 (×2): 50 ug/kg/min via INTRAVENOUS
  Administered 2015-02-28: 80 ug/kg/min via INTRAVENOUS
  Administered 2015-03-01 (×2): 50 ug/kg/min via INTRAVENOUS
  Filled 2015-02-28 (×6): qty 100

## 2015-02-28 MED ORDER — LORAZEPAM 2 MG/ML IJ SOLN: 0.5000 mg | INTRAMUSCULAR | Status: DC | PRN

## 2015-02-28 MED ORDER — HEPARIN BOLUS VIA INFUSION
1000.0000 [IU] | Freq: Once | INTRAVENOUS | Status: AC
  Administered 2015-02-28: 1000 [IU] via INTRAVENOUS
  Filled 2015-02-28: qty 1000

## 2015-02-28 MED ORDER — LACTATED RINGERS IV SOLN
INTRAVENOUS | Status: DC
  Administered 2015-02-28: 05:00:00 via INTRAVENOUS

## 2015-02-28 MED ORDER — HEPARIN BOLUS VIA INFUSION
2000.0000 [IU] | Freq: Once | INTRAVENOUS | Status: AC
  Administered 2015-02-28: 2000 [IU] via INTRAVENOUS
  Filled 2015-02-28: qty 2000

## 2015-02-28 MED ORDER — DEXTROSE IN LACTATED RINGERS 5 % IV SOLN
INTRAVENOUS | Status: DC
  Administered 2015-02-28 (×2): via INTRAVENOUS

## 2015-02-28 MED ORDER — FENTANYL CITRATE (PF) 2500 MCG/50ML IJ SOLN
0.0000 ug/h | Status: AC
  Administered 2015-02-28: 100 ug/h via INTRAVENOUS
  Filled 2015-02-28: qty 100

## 2015-02-28 MED ORDER — HEPARIN (PORCINE) IN NACL 100-0.45 UNIT/ML-% IJ SOLN
900.0000 [IU]/h | INTRAMUSCULAR | Status: DC
  Administered 2015-02-28: 500 [IU]/h via INTRAVENOUS
  Administered 2015-03-01: 800 [IU]/h via INTRAVENOUS
  Filled 2015-02-28: qty 250

## 2015-02-28 MED ORDER — LORAZEPAM 2 MG/ML IJ SOLN
INTRAMUSCULAR | Status: AC
  Administered 2015-02-28: 2 mg
  Filled 2015-02-28: qty 1

## 2015-02-28 NOTE — Progress Notes (Signed)
Dr. Sung AmabileSimonds rounded on patient and Vecoronium gtt discontinued, fentanyl gtt decreased to 50 mcg/hr, versed gtt at 2 mg/ hr, levo gtt off at this time due to MAP 85, goal MAP is 65. Informed Dr. Sung AmabileSimonds of decreased urine output.

## 2015-02-28 NOTE — Progress Notes (Signed)
ANTICOAGULATION CONSULT NOTE - Initial Consult  Pharmacy Consult for heparin Indication: chest pain/ACS  No Known Allergies  Patient Measurements: Height: 5\' 10"  (177.8 cm) Weight: 150 lb (68.04 kg) IBW/kg (Calculated) : 73 Heparin Dosing Weight: 68 kg  Vital Signs: Temp: 98.1 F (36.7 C) (12/24 1700) BP: 103/65 mmHg (12/24 1800) Pulse Rate: 84 (12/24 1800)  Labs:  Recent Labs  02/27/15 0220 02/27/15 0442  02/27/15 0743 02/27/15 1122  02/27/15 1747  02/27/15 2108 02/28/15 0034 02/28/15 0453 02/28/15 1120 02/28/15 1930  HGB 16.5 16.7  --   --   --   --   --   --   --   --  15.5  --   --   HCT 49.0 47.4  --   --   --   --   --   --   --   --  45.2  --   --   PLT 162 154  --   --   --   --   --   --   --   --  142*  --   --   APTT 32  --   --  75*  --   --   --   --   --   --   --   --   --   LABPROT 15.3*  --   --  15.3*  --   --   --   --   --   --   --   --   --   INR 1.19  --   --  1.19  --   --   --   --   --   --   --   --   --   HEPARINUNFRC  --   --   --   --   --   < >  --   < >  --   --  0.71* 0.21* 0.11*  CREATININE 1.11  --   < > 1.48*  --   < > 0.89  --  0.81 0.80 0.91  --   --   TROPONINI 31.27* 32.94*  --   --  27.11*  --  14.90*  --   --   --   --   --   --   < > = values in this interval not displayed.  Estimated Creatinine Clearance: 104.8 mL/min (by C-G formula based on Cr of 0.91).   Medical History: Past Medical History  Diagnosis Date  . Hepatitis C   . 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 Coronary artery disease   . Stroke (HCC)   . Depression     a. prior suicide attempts    Medications:  Infusions:  . dextrose 5% lactated ringers 75 mL/hr at 02/28/15 1900  . fentaNYL infusion INTRAVENOUS 100 mcg/hr (02/28/15 1900)  . heparin 500 Units/hr (02/28/15 1900)  . midazolam (VERSED) infusion 7.5 mg/hr (02/28/15 1912)  . norepinephrine (LEVOPHED) Adult infusion 15 mcg/min (02/28/15 2107)  . propofol (DIPRIVAN) infusion 50 mcg/kg/min  (02/28/15 1914)    Assessment: 39 yom cc ACS post drug over dose. Currently on hypothermia protocol and has received 5000 units subcutaneous x 2 doses.  HL= 0.79 Goal of Therapy:  Heparin level 0.3-0.7 units/ml Monitor platelets by anticoagulation protocol: Yes   Plan:  Heparin level subtherapeutic. Most recent temperature 98.1, pt appears to be rewarmed. Bolus 2000 units IV  x 1 and increase rate to 800 units/hr. Will recheck level in 6 hours.   Carola Frost, Pharm.D., BCPS Clinical Pharmacist 02/28/2015,10:20 PM

## 2015-02-28 NOTE — Progress Notes (Signed)
Attempted to decrease sedation and patient began seizure activity, sedation increased and seizure activity stopped.

## 2015-02-28 NOTE — Progress Notes (Signed)
eLink Physician-Brief Progress Note Patient Name: Roy ForehandBryan P Gordon DOB: 24-Jul-1975 MRN: [id number]   Date of Service  02/28/2015  HPI/Events of Note  RN notified of marginal UOP.   eICU Interventions  Start LR at 75cc/hr.     Intervention Category Intermediate Interventions: Oliguria - evaluation and management  Lawanda CousinsJennings Marcelyn Ruppe 02/28/2015, 3:30 AM

## 2015-02-28 NOTE — Progress Notes (Signed)
I called [facility] and updated them on patient

## 2015-02-28 NOTE — Progress Notes (Signed)
ANTICOAGULATION CONSULT NOTE - Initial Consult  Pharmacy Consult for heparin Indication: chest pain/ACS  No Known Allergies  Patient Measurements: Height: 5\' 10"  (177.8 cm) Weight: 150 lb (68.04 kg) IBW/kg (Calculated) : 73 Heparin Dosing Weight: 68 kg  Vital Signs: Temp: 93.7 F (34.3 C) (12/24 0515) BP: 105/74 mmHg (12/24 0500) Pulse Rate: 81 (12/24 0515)  Labs:  Recent Labs  02/27/15 0220 02/27/15 0442  02/27/15 0743 02/27/15 1122 02/27/15 1150 02/27/15 1747 02/27/15 1921 02/27/15 2108 02/28/15 0034 02/28/15 0453  HGB 16.5 16.7  --   --   --   --   --   --   --   --  15.5  HCT 49.0 47.4  --   --   --   --   --   --   --   --  45.2  PLT 162 154  --   --   --   --   --   --   --   --  142*  APTT 32  --   --  75*  --   --   --   --   --   --   --   LABPROT 15.3*  --   --  15.3*  --   --   --   --   --   --   --   INR 1.19  --   --  1.19  --   --   --   --   --   --   --   HEPARINUNFRC  --   --   --   --   --  0.62  --  0.79*  --   --  0.71*  CREATININE 1.11  --   < > 1.48*  --  1.28* 0.89  --  0.81 0.80 0.91  TROPONINI 31.27* 32.94*  --   --  27.11*  --  14.90*  --   --   --   --   < > = values in this interval not displayed.  Estimated Creatinine Clearance: 104.8 mL/min (by C-G formula based on Cr of 0.91).   Medical History: Past Medical History  Diagnosis Date  . Hepatitis C   . 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 Coronary artery disease   . Stroke (HCC)   . Depression     a. prior suicide attempts    Medications:  Infusions:  . fentaNYL infusion INTRAVENOUS 100 mcg/hr (02/28/15 0500)  . heparin 450 Units/hr (02/28/15 0500)  . lactated ringers 75 mL/hr at 02/28/15 0500  . midazolam (VERSED) infusion 4 mg/hr (02/28/15 0500)  . norepinephrine (LEVOPHED) Adult infusion 5.013 mcg/min (02/28/15 0500)  . vecuronium (NORCURON) infusion 0.404 mcg/kg/min (02/28/15 0500)    Assessment: 39 yom cc ACS post drug over dose. Currently on hypothermia  protocol and has received 5000 units subcutaneous x 2 doses.  HL= 0.79 Goal of Therapy:  Heparin level 0.3-0.7 units/ml Monitor platelets by anticoagulation protocol: Yes   Plan:  Heparin level is above goal so will decrease heparin infusion to 400 units/hr and check a heparin level in 6 hours.   Carola FrostNathan A Atha Muradyan, Pharm.D., BCPS Clinical Pharmacist 02/28/2015,6:02 AM

## 2015-02-28 NOTE — Progress Notes (Addendum)
[facility] [place] at [facility]                                                                                                                                                                                            Patient Demographics   Roy Gordon, is a 39 y.o. male, DOB - Aug 19, 1975, UJW:[id number]  Admit date - 02/08/2015   Admitting Physician Oralia Manis, MD  Outpatient Primary MD for the patient is No primary care provider on file.   LOS - 2  Subjective:  Patient sedation was held this morning, subsequently he started having a seizure. Therefore he was restarted on were said and Diprvan. He is currently ventilated    Review of Systems:   CONSTITUTIONAL: Unable to provide due him being intubated    Vitals:   Filed Vitals:   02/28/15 0900 02/28/15 1000 02/28/15 1100 02/28/15 1200  BP: 100/74 99/72 74/57  103/66  Pulse: 95 92 98 87  Temp: 96.1 F (35.6 C) 95.7 F (35.4 C) 97 F (36.1 C) 97.2 F (36.2 C)  TempSrc:      Resp: Height:      Weight:      SpO2: 100% 100% 100% 100%    Wt Readings from Last 3 Encounters:  02/13/2015 68.04 kg (150 lb)     Intake/Output Summary (Last 24 hours) at 02/28/15 1310 Last data filed at 02/28/15 1200  Gross per 24 hour  Intake 1973.68 ml  Output   1175 ml  Net 798.68 ml    Physical Exam:   GENERAL: Critically ill HEAD, EYES, EARS, NOSE AND THROAT: Atraumatic, normocephalic.. Pupils equal and reactive to light. Sclerae anicteric. No conjunctival injection. No oro-pharyngeal erythema.  NECK: Supple. There is no jugular venous distention. No bruits, no lymphadenopathy, no thyromegaly.  HEART: Regular rate and rhythm,. No murmurs, no rubs, no clicks.  LUNGS: Clear to auscultation bilaterally. No rales or rhonchi. No wheezes.  ABDOMEN: Soft, flat, nontender, nondistended. Has good bowel sounds. No hepatosplenomegaly appreciated.  EXTREMITIES: No evidence of any cyanosis, clubbing, or  peripheral edema.  +2 pedal and radial pulses bilaterally.  NEUROLOGIC: Poorly responsive on the ventilator  SKIN: Moist and warm with no rashes appreciated.  Psych: Intubated LN: No inguinal LN enlargement    Antibiotics   Anti-infectives    Start     Dose/Rate Route Frequency Ordered Stop   02/27/15 1100  vancomycin (VANCOCIN) 50 mg/mL oral solution 125 mg     125 mg Per Tube 4 times daily 02/27/15 1057 03/13/15 0959      Medications   Scheduled  Meds: . antiseptic oral rinse  7 mL Mouth Rinse 10 times per day  . artificial tears  1 application Both Eyes 3 times per day  . aspirin  325 mg Per Tube Daily  . chlorhexidine gluconate  15 mL Mouth Rinse BID  . levETIRAcetam  1,000 mg Intravenous Q12H  . pantoprazole (PROTONIX) IV  40 mg Intravenous Daily  . vancomycin  125 mg Per Tube QID   Continuous Infusions: . dextrose 5% lactated ringers 75 mL/hr at 02/28/15 1200  . fentaNYL infusion INTRAVENOUS 100 mcg/hr (02/28/15 1200)  . heparin 400 Units/hr (02/28/15 1200)  . midazolam (VERSED) infusion 7.5 mg/hr (02/28/15 1200)  . norepinephrine (LEVOPHED) Adult infusion 15 mcg/min (02/28/15 1253)  . propofol (DIPRIVAN) infusion 50 mcg/kg/min (02/28/15 1200)   PRN Meds:.   Data Review:   Micro Results Recent Results (from the past 240 hour(s))  C difficile quick scan w PCR reflex     Status: Abnormal   Collection Time: 02/27/15  2:26 AM  Result Value Ref Range Status   C Diff antigen POSITIVE (A) NEGATIVE Final   C Diff toxin NEGATIVE NEGATIVE Final   C Diff interpretation   Final    Positive for toxigenic C. difficile, active toxin production not detected. Patient has toxigenic C. difficile organisms present in the bowel, but toxin was not detected. The patient may be a carrier or the level of toxin in the sample was below the limit  of detection. This information should be used in conjunction with the patient's clinical history when deciding on possible therapy.   MRSA PCR  Screening     Status: None   Collection Time: 02/27/15  2:26 AM  Result Value Ref Range Status   MRSA by PCR NEGATIVE NEGATIVE Final    Comment:        The GeneXpert MRSA Assay (FDA approved for NASAL specimens only), is one component of a comprehensive MRSA colonization surveillance program. It is not intended to diagnose MRSA infection nor to guide or monitor treatment for MRSA infections.   Clostridium Difficile by PCR     Status: Abnormal   Collection Time: 02/27/15  2:26 AM  Result Value Ref Range Status   Toxigenic C Difficile by pcr POSITIVE (A) NEGATIVE Final    Comment: CRITICAL RESULT CALLED TO, READ BACK BY AND VERIFIED WITH: MONIQUE ROBERSON 02/27/15 0431 SJL     Radiology Reports Dg Chest 1 View  02/27/2015  CLINICAL DATA:  Central line placement EXAM: CHEST 1 VIEW COMPARISON:  Yesterday FINDINGS: New right IJ line, tip at the upper cavoatrial junction. There is no pneumothorax or new mediastinal widening. Unchanged positioning of endotracheal and orogastric tubes. There is no edema, consolidation, effusion, or pneumothorax. Normal heart size and mediastinal contours. IMPRESSION: 1. New right IJ line without complicating feature. 2. Lungs remain clear. Electronically Signed   By: Marnee SpringJonathon  Watts M.D.   On: 02/27/2015 01:57   Ct Head Wo Contrast  2014/10/29  CLINICAL DATA:  Witnessed cardiac arrest.  Unresponsive. EXAM: CT HEAD WITHOUT CONTRAST TECHNIQUE: Contiguous axial images were obtained from the base of the skull through the vertex without intravenous contrast. COMPARISON:  12/09/2011 FINDINGS: Skull and Sinuses:No acute or traumatic finding Visualized orbits: Negative. Brain: There is blurred gray-white differentiation at the cortex, without discrete infarct, hemorrhage, hydrocephalus, or herniation. Deep gray nuclei are still distinct. High-density appearance of the major intracranial veins is likely related to brain edema. There is no historical findings to suggest  primary venous occlusive  disease. These results were called by telephone at the time of interpretation on 03-27-15 at 10:44 pm to Dr. Scotty Court, who verbally acknowledged these results. 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 Electronically Signed   By: Marnee Spring M.D.   On: March 27, 2015 22:50   Dg Chest Port 1 View  02/28/2015  CLINICAL DATA:  Respiratory failure EXAM: PORTABLE CHEST 1 VIEW COMPARISON:  None. FINDINGS: Endotracheal tube, NG tube, and RIGHT central venous line are unchanged. Stable cardiac silhouette. No effusion, infiltrate, pneumothorax. IMPRESSION: 1. Support apparatus unchanged. 2. No acute cardiopulmonary findings. Electronically Signed   By: Genevive Bi M.D.   On: 02/28/2015 07:28   Dg Chest Portable 1 View  2015-03-27  CLINICAL DATA:  EMS called. Patient on floor of bathroom unresponsive. Patient's girlfriend told EMS that patient has been using heroin today. Patient was found at [facility] in the [place]. Arrest was witnessed PEA. Defib x 1 for VTach. EXAM: PORTABLE CHEST 1 VIEW COMPARISON:  05/05/2013 FINDINGS: Lungs are clear. No pleural effusion or gross pneumothorax. Heart, mediastinum hila are unremarkable. Endotracheal tube tip projects 3.5 cm above the carina. Orogastric tube passes below the diaphragm. IMPRESSION: 1. No acute cardiopulmonary disease. 2. Support apparatus is well positioned. Electronically Signed   By: Amie Portland M.D.   On: 03/27/15 20:52     CBC  Recent Labs Lab Mar 27, 2015 2020 02/27/15 0220 02/27/15 0442 02/28/15 0453  WBC 24.2* 23.0* 21.0* 15.8*  HGB 15.3 16.5 16.7 15.5  HCT 45.8 49.0 47.4 45.2  PLT 232 162 154 142*  MCV 91.1 87.7 88.6 89.1  MCH 30.4 29.6 31.3 30.6  MCHC 33.3 33.7 35.3 34.4  RDW 13.6 13.3 13.1 13.3  LYMPHSABS 7.9*  --   --   --   MONOABS 0.7  --   --   --   EOSABS 0.2  --   --   --   BASOSABS 0.1  --   --   --     Chemistries   Recent Labs Lab March 27, 2015 2020  02/27/15 1150  02/27/15 1747 02/27/15 2108 02/28/15 0034 02/28/15 0453  NA 136  < > 138 139 138 140 140  K 4.5  < > 4.0 3.6 3.9 3.7 3.6  CL 102  < > 113* 114* 113* 113* 112*  CO2 16*  < > 17* 18* 17* 20* 19*  GLUCOSE 319*  < > 105* 120* 104* 92 95  BUN 19  < > CREATININE 1.73*  < > 1.28* 0.89 0.81 0.80 0.91  CALCIUM 8.2*  < > 8.5* 8.2* 8.3* 8.5* 8.4*  AST 70*  --   --   --   --   --  140*  ALT 69*  --   --   --   --   --  94*  ALKPHOS 102  --   --   --   --   --  68  BILITOT 0.8  --   --   --   --   --  1.0  < > = values in this interval not displayed. ------------------------------------------------------------------------------------------------------------------ estimated creatinine clearance is 104.8 mL/min (by C-G formula based on Cr of 0.91). ------------------------------------------------------------------------------------------------------------------ No results for input(s): HGBA1C in the last 72 hours. ------------------------------------------------------------------------------------------------------------------  Recent Labs  02/28/15 1120  TRIG 133   ------------------------------------------------------------------------------------------------------------------ No results for input(s): TSH, T4TOTAL, T3FREE, THYROIDAB in the last 72 hours.  Invalid input(s): FREET3 ------------------------------------------------------------------------------------------------------------------ No results for input(s): VITAMINB12, FOLATE,  FERRITIN, TIBC, IRON, RETICCTPCT in the last 72 hours.  Coagulation profile  Recent Labs Lab 02/27/15 0220 02/27/15 0743  INR 1.19 1.19    No results for input(s): DDIMER in the last 72 hours.  Cardiac Enzymes  Recent Labs Lab 02/27/15 0442 02/27/15 1122 02/27/15 1747  TROPONINI 32.94* 27.11* 14.90*   ------------------------------------------------------------------------------------------------------------------ Invalid  input(s): POCBNP    Assessment & Plan   Principal Problem: 1.  Cardiac arrest (HCC) - secondary to drug overdose. Continue supportive care , Prognosis very poor. Likely severe anoxic brain injury based on the CT scan findings on admission. Now seizing with withdrawal of sedating medications.  2.  Drug overdose - tox screen positive for opiates and PCP.  Substance abuse counseling if he survives this incident. 3. Acute respiratory failure with hypoxia (HCC) - intubated, stable with mechanical ventilation at this time. Continue to monitor. 4. Acute renal insufficiency provide IV fluids monitor renal function 5. Elevated troponin suspect due to cardiac arrest  appreciate cardiology input continue IV heparin  6. Seizure continue Keppra, versed and diprivan, neuro input for prognosis as well, repeat CT of the head pending    Code Status Orders        Start     Ordered   02/27/15 0201  Full code   Continuous     02/27/15 0200           Consults  pulmonary critical care   DVT Prophylaxis  Lovenox - Heparin - SCDs  Lab Results  Component Value Date   PLT 142* 02/28/2015     Time Spent in minutes 35 minutes of critical care time spent   Auburn Bilberry M.D on 02/28/2015 at 1:10 PM  Between 7am to 6pm - Pager - [phone]  After 6pm go to [email or web address] - password [email or web address] [facility]  [facility]   Office  [phone]

## 2015-02-28 NOTE — Progress Notes (Signed)
Patient transported to CT and back to room ICU12 on trilogy transport vent without incident.  Patient returned to room and placed back on Servo i vent.

## 2015-02-28 NOTE — Progress Notes (Signed)
Patient's father in for a visit and an update was given. I informed him Dr. Sung AmabileSimonds wants to meet with the family in the morning and he said he will be here.

## 2015-02-28 NOTE — Progress Notes (Signed)
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 Cerebral edema on initial CT head. Hypothermia protocol implemented.   MAJOR EVENTS/TEST RESULTS: 12/22 admitted after cardiac arrest. Hypothermia protocol. Troponin I elevated. Hypotension - norepinephrine initiated 12/22 CT head: Blurred cortex concerning for global anoxic injury 12/23 diarrhea. C diff PCR and toxin positive. Enteral vanc initiated. Norepinephrine weaned to off then resumed for recurrent hypotension 12/24 Rewarmed. NMBs discontinued. apparent seizure activity. Propofol initiated. Loaded with Keppra. Repeat Ct head and EEG ordered 12/24 CT head (repeat):    INDWELLING DEVICES:: ETT 12/22 >>  R IJ CVL 12/22 >>  L femoral A-line 12/22 >>   MICRO DATA: MRSA PCR 12/22 >> NEG C diff PCR 12/23 >> POS  ANTIMICROBIALS:  Vanc (enteral) 12/23 >>    SUBJECTIVE:  RASS -5. Diffuse convulsive twitching c/w seizures  VITAL SIGNS: BP 100/74 mmHg  Pulse 95  Temp(Src) 96.1 F (35.6 C) (Core (Comment))  Resp 24  Ht 5\' 10"  (1.778 m)  Wt 68.04 kg (150 lb)  BMI 21.52 kg/m2  SpO2 100%  HEMODYNAMICS: CVP:  [2 mmHg-11 mmHg] 10 mmHg  VENTILATOR SETTINGS: Vent Mode:  [-] PRVC FiO2 (%):  [30 %] 30 % Set Rate:  [24 bmp] 24 bmp Vt Set:  [450 mL] 450 mL PEEP:  [5 cmH20] 5 cmH20 Plateau Pressure:  [14 cmH20] 14 cmH20  INTAKE / OUTPUT: I/O last 3 completed shifts: In: 1030.4 [I.V.:1030.4] Out: 2655 [Urine:1005; Emesis/NG output:150; Stool:1500]  PHYSICAL EXAMINATION:  Gen: RASS -5, no spont movement, intubated, sedated HEENT: NCAT, WNL Neck: NO LAN, no JVD noted Lungs: full BS, no adventitious sounds Cardiovascular: Reg, no M noted Abdomen: Soft, NT +BS Ext: no C/C/E Neuro: pupils pinpoint, no spont  movement, no withdrawal from pain Skin: No lesions noted   LABS:  BMET  Recent Labs Lab 02/27/15 2108 02/28/15 0034 02/28/15 0453  NA 138 140 140  K 3.9 3.7 3.6  CL 113* 113* 112*  CO2 17* 20* 19*  BUN 15 16 15   CREATININE 0.81 0.80 0.91  GLUCOSE 104* 92 95    Electrolytes  Recent Labs Lab 02/27/15 2108 02/28/15 0034 02/28/15 0453  CALCIUM 8.3* 8.5* 8.4*    CBC  Recent Labs Lab 02/27/15 0220 02/27/15 0442 02/28/15 0453  WBC 23.0* 21.0* 15.8*  HGB 16.5 16.7 15.5  HCT 49.0 47.4 45.2  PLT 162 154 142*    Coag's  Recent Labs Lab 02/27/15 0220 02/27/15 0743  APTT 32 75*  INR 1.19 1.19    Sepsis Markers  Recent Labs Lab 2014/11/25 2020 02/27/15 0226  LATICACIDVEN 9.0* 1.4    ABG  Recent Labs Lab 02/27/15 1800 02/28/15 0453 02/28/15 1020  PHART 7.31* 7.41 7.39  PCO2ART 29.0* 22* 30*  PO2ART 183* 203* 135*    Liver Enzymes  Recent Labs Lab 2014/11/25 2020 02/28/15 0453  AST 70* 140*  ALT 69* 94*  ALKPHOS 102 68  BILITOT 0.8 1.0  ALBUMIN 3.8 3.5    Cardiac Enzymes  Recent Labs Lab 02/27/15 0442 02/27/15 1122 02/27/15 1747  TROPONINI 32.94* 27.11* 14.90*    Glucose  Recent Labs Lab 02/27/15 1802 02/27/15 1913 02/27/15 1914 02/27/15 2337 02/28/15 0403 02/28/15 0728  GLUCAP 104* 50* 106* 76 77 98    Imaging Dg  Chest Port 1 View  02/28/2015  CLINICAL DATA:  Respiratory failure EXAM: PORTABLE CHEST 1 VIEW COMPARISON:  None. FINDINGS: Endotracheal tube, NG tube, and RIGHT central venous line are unchanged. Stable cardiac silhouette. No effusion, infiltrate, pneumothorax. IMPRESSION: 1. Support apparatus unchanged. 2. No acute cardiopulmonary findings. Electronically Signed   By: Genevive Bi M.D.   On: 02/28/2015 07:28     ASSESSMENT / PLAN:  PULMONARY A: VDRF post arrest P:   Cont full vent support - settings reviewed and/or adjusted Cont vent bundle Daily SBT if/when meets criteria  CARDIOVASCULAR A:   Cardiac arrest Acute MI Hypotension P:  Cardiology following - discussed with Dr Kirke Corin Echocardiogram performed 12/23 - report pending MAP goal now 65 mmHg - wean NE to off as able  RENAL A:   AKI Oliguria P:   Monitor BMET intermittently Monitor I/Os Correct electrolytes as indicated  GASTROINTESTINAL A:   No issues P:   SUP: IV PPI No nutrition until after rewarmed  HEMATOLOGIC A:   Mild thrombocytopenia P:  DVT px: Full dose heparin Monitor CBC intermittently Transfuse per usual guidelines Might need to DC heparin if platelets continue to drop  INFECTIOUS A:   C diff colitis P:   Monitor temp, WBC count Micro and abx as above  ENDOCRINE A:   No issues P:   Change CBGs to q 8 hrs  NEUROLOGIC A:   Post anoxic encephalopathy Cerebral edema post arrest - poor prognosis Status epilepticus P:   RASS goal: -1, -2 Repeat CT head 12/24 Propofol gtt (note cessation of seizure activity after initiation) Begin Keppra 12/24    FAMILY  - Updates: No family available 12/24  CCM time: 45 mins The above time includes time spent in consultation with patient and/or family members and reviewing care plan on multidisciplinary rounds  Billy Fischer, MD PCCM service Mobile [phone] Pager [phone]  02/28/2015, 11:24 AM

## 2015-02-28 NOTE — Progress Notes (Signed)
Patient continues to have seizures after ativan given. Dr. Sung AmabileSimonds notified and new orders received.

## 2015-02-28 NOTE — Progress Notes (Signed)
Patient with witnessed seizure activity. Dr. Sung AmabileSimonds notified and assessed patient. New orders received. A-line with dampened waveform and leaking at insertion site.

## 2015-03-01 ENCOUNTER — Inpatient Hospital Stay: Payer: Medicaid Other

## 2015-03-01 LAB — BLOOD GAS, ARTERIAL
ACID-BASE DEFICIT: 3.3 mmol/L — AB (ref 0.0–2.0)
ACID-BASE DEFICIT: 4.2 mmol/L — AB (ref 0.0–2.0)
Allens test (pass/fail): POSITIVE — AB
Bicarbonate: 19.9 mEq/L — ABNORMAL LOW (ref 21.0–28.0)
Bicarbonate: 22.6 mEq/L (ref 21.0–28.0)
FIO2: 0.3
FIO2: 0.48
LHR: 24 {breaths}/min
O2 SAT: 98.4 %
O2 SAT: 98.4 %
PCO2 ART: 30 mmHg — AB (ref 32.0–48.0)
PCO2 ART: 47 mmHg (ref 32.0–48.0)
PH ART: 7.29 — AB (ref 7.350–7.450)
PO2 ART: 110 mmHg — AB (ref 83.0–108.0)
PO2 ART: 123 mmHg — AB (ref 83.0–108.0)
Patient temperature: 37
Patient temperature: 37
VT: 450 mL
pH, Arterial: 7.43 (ref 7.350–7.450)

## 2015-03-01 LAB — CBC
HCT: 39 % — ABNORMAL LOW (ref 40.0–52.0)
HEMATOCRIT: 40.4 % (ref 40.0–52.0)
HEMOGLOBIN: 13.9 g/dL (ref 13.0–18.0)
HEMOGLOBIN: 13.6 g/dL (ref 13.0–18.0)
MCH: 30.2 pg (ref 26.0–34.0)
MCH: 30.6 pg (ref 26.0–34.0)
MCHC: 34.4 g/dL (ref 32.0–36.0)
MCHC: 34.9 g/dL (ref 32.0–36.0)
MCV: 87.8 fL (ref 80.0–100.0)
MCV: 87.7 fL (ref 80.0–100.0)
PLATELETS: 147 10*3/uL — AB (ref 150–440)
Platelets: 194 10*3/uL (ref 150–440)
RBC: 4.59 MIL/uL (ref 4.40–5.90)
RBC: 4.45 MIL/uL (ref 4.40–5.90)
RDW: 13.5 % (ref 11.5–14.5)
RDW: 13.7 % (ref 11.5–14.5)
WBC: 18.8 10*3/uL — ABNORMAL HIGH (ref 3.8–10.6)
WBC: 15.1 10*3/uL — ABNORMAL HIGH (ref 3.8–10.6)

## 2015-03-01 LAB — GLUCOSE, CAPILLARY
GLUCOSE-CAPILLARY: 165 mg/dL — AB (ref 65–99)
GLUCOSE-CAPILLARY: 113 mg/dL — AB (ref 65–99)
GLUCOSE-CAPILLARY: 100 mg/dL — AB (ref 65–99)
GLUCOSE-CAPILLARY: 101 mg/dL — AB (ref 65–99)
Glucose-Capillary: 133 mg/dL — ABNORMAL HIGH (ref 65–99)
Glucose-Capillary: 182 mg/dL — ABNORMAL HIGH (ref 65–99)

## 2015-03-01 LAB — HEPARIN LEVEL (UNFRACTIONATED)
HEPARIN UNFRACTIONATED: 0.19 [IU]/mL — AB (ref 0.30–0.70)
Heparin Unfractionated: 0.29 IU/mL — ABNORMAL LOW (ref 0.30–0.70)
Heparin Unfractionated: 0.44 IU/mL (ref 0.30–0.70)

## 2015-03-01 LAB — COMPREHENSIVE METABOLIC PANEL
ALBUMIN: 3.1 g/dL — AB (ref 3.5–5.0)
ALK PHOS: 63 U/L (ref 38–126)
ALT: 77 U/L — ABNORMAL HIGH (ref 17–63)
ANION GAP: 4 — AB (ref 5–15)
AST: 115 U/L — ABNORMAL HIGH (ref 15–41)
BILIRUBIN TOTAL: 0.7 mg/dL (ref 0.3–1.2)
BUN: 8 mg/dL (ref 6–20)
CALCIUM: 8.1 mg/dL — AB (ref 8.9–10.3)
CO2: 22 mmol/L (ref 22–32)
Chloride: 112 mmol/L — ABNORMAL HIGH (ref 101–111)
Creatinine, Ser: 1.11 mg/dL (ref 0.61–1.24)
Glucose, Bld: 148 mg/dL — ABNORMAL HIGH (ref 65–99)
Potassium: 3.3 mmol/L — ABNORMAL LOW (ref 3.5–5.1)
Sodium: 138 mmol/L (ref 135–145)
TOTAL PROTEIN: 5.8 g/dL — AB (ref 6.5–8.1)

## 2015-03-01 LAB — TRIGLYCERIDES: Triglycerides: 88 mg/dL (ref <150)

## 2015-03-01 MED ORDER — FENTANYL CITRATE (PF) 100 MCG/2ML IJ SOLN
50.0000 ug | Freq: Once | INTRAMUSCULAR | Status: AC
  Administered 2015-03-01: 50 ug via INTRAVENOUS
  Filled 2015-03-01: qty 2

## 2015-03-01 MED ORDER — PANTOPRAZOLE SODIUM 40 MG PO PACK
40.0000 mg | PACK | Freq: Every day | ORAL | Status: DC
  Administered 2015-03-01 – 2015-03-02 (×2): 40 mg
  Filled 2015-03-01 (×2): qty 20

## 2015-03-01 MED ORDER — PROPOFOL 1000 MG/100ML IV EMUL
0.0000 ug/kg/min | INTRAVENOUS | Status: DC
  Administered 2015-03-01: 20 ug/kg/min via INTRAVENOUS
  Administered 2015-03-02 (×3): 30 ug/kg/min via INTRAVENOUS
  Administered 2015-03-03: 35 ug/kg/min via INTRAVENOUS
  Filled 2015-03-01 (×5): qty 100

## 2015-03-01 MED ORDER — FREE WATER
100.0000 mL | Freq: Three times a day (TID) | Status: DC
  Administered 2015-03-01 – 2015-03-03 (×4): 100 mL

## 2015-03-01 MED ORDER — SODIUM CHLORIDE 0.9 % IV SOLN
0.0000 mg/h | INTRAVENOUS | Status: DC
  Administered 2015-03-01: 7.5 mg/h via INTRAVENOUS

## 2015-03-01 MED ORDER — HEPARIN BOLUS VIA INFUSION
1000.0000 [IU] | Freq: Once | INTRAVENOUS | Status: AC
  Administered 2015-03-01: 1000 [IU] via INTRAVENOUS
  Filled 2015-03-01: qty 1000

## 2015-03-01 MED ORDER — VITAL HIGH PROTEIN PO LIQD
1000.0000 mL | ORAL | Status: DC
  Administered 2015-03-01: 1000 mL

## 2015-03-01 MED ORDER — POTASSIUM CHLORIDE 20 MEQ/15ML (10%) PO SOLN
40.0000 meq | Freq: Two times a day (BID) | ORAL | Status: AC
  Administered 2015-03-01 (×2): 40 meq
  Filled 2015-03-01 (×2): qty 30

## 2015-03-01 MED ORDER — FENTANYL CITRATE (PF) 2500 MCG/50ML IJ SOLN: 0.0000 ug/h | Status: DC

## 2015-03-01 MED ORDER — HEPARIN BOLUS VIA INFUSION
2040.0000 [IU] | Freq: Once | INTRAVENOUS | Status: DC
  Filled 2015-03-01: qty 2040

## 2015-03-01 MED ORDER — ACETAMINOPHEN 160 MG/5ML PO SOLN
650.0000 mg | ORAL | Status: DC | PRN
  Administered 2015-03-01 – 2015-03-02 (×2): 650 mg
  Filled 2015-03-01 (×2): qty 20.3

## 2015-03-01 MED ORDER — INSULIN ASPART 100 UNIT/ML ~~LOC~~ SOLN
2.0000 [IU] | SUBCUTANEOUS | Status: DC
  Administered 2015-03-01: 2 [IU] via SUBCUTANEOUS
  Administered 2015-03-01: 4 [IU] via SUBCUTANEOUS
  Filled 2015-03-01: qty 2
  Filled 2015-03-01: qty 4

## 2015-03-01 MED ORDER — PROPOFOL 1000 MG/100ML IV EMUL: 0.0000 ug/kg/min | INTRAVENOUS | Status: DC

## 2015-03-01 MED ORDER — HEPARIN (PORCINE) IN NACL 100-0.45 UNIT/ML-% IJ SOLN: 16.0000 [IU]/kg/h | INTRAMUSCULAR | Status: DC

## 2015-03-01 NOTE — Progress Notes (Signed)
eLink Physician-Brief Progress Note Patient Name: Roy ForehandBryan P Gordon DOB: [birth year] MRN: [id number]   Date of Service  03/01/2015  HPI/Events of Note  Possible aspiration RR up Tube feedings in mouth noted by RN  eICU Interventions  Hold tube feedings for now OG to low wall suction     Intervention Category Minor Interventions: Routine modifications to care plan (e.g. PRN medications for pain, fever)  Roy Gordon 03/01/2015, 6:24 PM

## 2015-03-01 NOTE — Progress Notes (Signed)
Orders for apnea test. Apnea test performed for 8 minutes. Disconnected patient from vent. Deflated cuff and ran O2 tubing down ET tube at 8 lpm per protocol. Waited 8 minutes and then drew abg off a-line. During test, patient had spontaneous respirations from 32-38 bpm. Ended apnea test and placed patient back on previous settings. MD present.

## 2015-03-01 NOTE — Progress Notes (Signed)
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 Cerebral edema on initial CT head. Hypothermia protocol implemented.   MAJOR EVENTS/TEST RESULTS: 12/22 admitted after cardiac arrest. Hypothermia protocol. Troponin I elevated. Hypotension - norepinephrine initiated 12/22 CT head: Blurred cortex concerning for global anoxic injury 12/23 diarrhea. C diff PCR and toxin positive. Enteral vanc initiated. Norepinephrine weaned to off then resumed for recurrent hypotension 12/23 TTE: The estimated ejection fraction was in the range of 45% to 50%. Cannot exclude hypokinesis of the anteroseptal, anterior, and anterolateral myocardium. Doppler parameters are consistent with abnormal left ventricular relaxation (grade 1 diastolic dysfunction). 12/24 Rewarmed. NMBs discontinued. apparent seizure activity. Propofol initiated. Loaded with Keppra. Repeat Ct head and EEG ordered 12/24 CT head (repeat): Progression of cerebral edema 12/25 prolonged unresponsiveness after all sedation stopped. Apnea test performed. + spontaneous respiratory effort 12/25 detailed discussion with extended family. Poor prognosis conveyed. Recommended DNR if he arrests again. Established plan of EEG and Neuro consult 12/26 for prognostication with possible discussion of withdrawal after that  INDWELLING DEVICES:: ETT 12/22 >>  R IJ CVL 12/22 >>  L femoral A-line 12/22 >>   MICRO DATA: MRSA PCR 12/22 >> NEG C diff PCR 12/23 >> POS  ANTIMICROBIALS:  Vanc (enteral) 12/23 >>    SUBJECTIVE:  RASS -5. Prolonged unresponsiveness after all sedation stopped. Apnea test performed. + spontaeous respiratory effort.   VITAL SIGNS: BP 149/91 mmHg  Pulse 98  Temp(Src) 98.4 F (36.9 C) (Core (Comment))  Resp 20   Ht 5\' 10"  (1.778 m)  Wt 150 lb (68.04 kg)  BMI 21.52 kg/m2  SpO2 99%  HEMODYNAMICS: CVP:  [2 mmHg-11 mmHg] 10 mmHg  VENTILATOR SETTINGS: Vent Mode:  [-] PRVC FiO2 (%):  [30 %] 30 % Set Rate:  [24 bmp] 24 bmp Vt Set:  [450 mL] 450 mL PEEP:  [5 cmH20] 5 cmH20 Plateau Pressure:  [14 cmH20] 14 cmH20  INTAKE / OUTPUT: I/O last 3 completed shifts: In: 5012.6 [I.V.:4762.6; NG/GT:30; IV Piggyback:220] Out: 1600 [Urine:1150; Emesis/NG output:450]  PHYSICAL EXAMINATION:  Gen: RASS -5, no spont movement, intubated, sedated HEENT: NCAT, WNL Neck: NO LAN, no JVD noted Lungs: full BS, no adventitious sounds Cardiovascular: Reg, no M noted Abdomen: Soft, NT +BS Ext: no C/C/E Neuro: pupils pinpoint, no spont movement, no withdrawal from pain Skin: No lesions noted   LABS:  BMET  Recent Labs Lab 02/28/15 0034 02/28/15 0453 03/01/15 0327  NA 140 140 138  K 3.7 3.6 3.3*  CL 113* 112* 112*  CO2 20* 19* 22  BUN 16 15 8   CREATININE 0.80 0.91 1.11  GLUCOSE 92 95 148*    Electrolytes  Recent Labs Lab 02/28/15 0034 02/28/15 0453 03/01/15 0327  CALCIUM 8.5* 8.4* 8.1*    CBC  Recent Labs Lab 02/27/15 0442 02/28/15 0453 03/01/15 0327  WBC 21.0* 15.8* 18.8*  HGB 16.7 15.5 13.9  HCT 47.4 45.2 40.4  PLT 154 142* 194    Coag's  Recent Labs Lab 02/27/15 0220 02/27/15 0743  APTT 32 75*  INR 1.19 1.19    Sepsis Markers  Recent Labs Lab 06/01/14 2020 02/27/15 0226  LATICACIDVEN 9.0* 1.4    ABG  Recent Labs Lab 02/28/15 1020 03/01/15 0500 03/01/15 1056  PHART 7.39 7.43 7.29*  PCO2ART 30* 30* 47  PO2ART 135* 110* 123*    Liver Enzymes  Recent Labs Lab 02/17/2015 2020 02/28/15 0453 03/01/15 0327  AST 70* 140* 115*  ALT 69* 94* 77*  ALKPHOS 102 68 63  BILITOT 0.8 1.0 0.7  ALBUMIN 3.8 3.5 3.1*    Cardiac Enzymes  Recent Labs Lab 02/27/15 0442 02/27/15 1122 02/27/15 1747  TROPONINI 32.94* 27.11* 14.90*    Glucose  Recent  Labs Lab 02/28/15 1621 03/01/15 0001 03/01/15 0132 03/01/15 0357 03/01/15 0720 03/01/15 1234  GLUCAP 171* 182* 165* 133* 113* 100*    Imaging Dg Chest Port 1 View  03/01/2015  CLINICAL DATA:  39 year old male with acute respiratory failure secondary to drug overdose EXAM: PORTABLE CHEST 1 VIEW COMPARISON:  Prior chest x-ray 02/28/2015 FINDINGS: The patient is intubated. The tip of the endotracheal tube is 4 cm above the carina. There is a right IJ approach central venous catheter the tip of which appears well positioned overlying the superior cavoatrial junction. A gastric tube is present. The proximal side hole overlies the gastric fundus. Cardiac and mediastinal contours are within normal limits. There may be minimal right basilar atelectasis. Otherwise, the lungs are clear. No acute osseous abnormality. IMPRESSION: 1. Stable and satisfactory support apparatus. 2. Perhaps mild right basilar atelectasis. Electronically Signed   By: Malachy Moan M.D.   On: 03/01/2015 09:43     ASSESSMENT / PLAN:  PULMONARY A: VDRF post arrest P:   Cont full vent support - settings reviewed and/or adjusted Cont vent bundle Daily SBT if/when meets criteria  CARDIOVASCULAR A:  Cardiac arrest Acute MI Hypotension P:  Cardiology following - discussed with Dr Kirke Corin Echocardiogram performed 12/23 - report pending MAP goal now 65 mmHg - wean NE to off as able  RENAL A:   AKI Oliguria P:   Monitor BMET intermittently Monitor I/Os Correct electrolytes as indicated Not a candidate for HD presently  GASTROINTESTINAL A:   No issues P:   SUP: IV PPI Begin TFs 12/25  HEMATOLOGIC A:   Mild thrombocytopenia, resolved P:  DVT px: Full dose heparin Monitor CBC intermittently Transfuse per usual guidelines  INFECTIOUS A:   C diff colitis P:   Monitor temp, WBC count Micro and abx as above  ENDOCRINE A:   No issues P:   Change CBGs to q 8 hrs  NEUROLOGIC A:   Severe post  anoxic encephalopathy Cerebral edema post arrest - poor prognosis Status epilepticus P:   RASS goal: -1, -2 EEG 12/25 Neuro consult 12/25 Propofol gtt as needed for recurrent seizure Cont Keppra - initiated 12/24    FAMILY  Extended family discussion as noted above  CCM time: 60 mins The above time includes time spent in consultation with patient and/or family members and reviewing care plan on multidisciplinary rounds  Billy Fischer, MD PCCM service Mobile [phone] Pager [phone]  03/01/2015, 2:59 PM

## 2015-03-01 NOTE — Progress Notes (Signed)
   03/01/15 1200  Clinical Encounter Type  Visited With Patient;Family;Patient and family together  Visit Type Initial;Follow-up;Spiritual support  Referral From Nurse;Physician  Consult/Referral To Chaplain  Spiritual Encounters  Spiritual Needs Emotional  Stress Factors  Patient Stress Factors Not reviewed  Family Stress Factors Health changes;Major life changes  Chaplain offered a compassionate presence and support for family as they made medical decisons. Aunt asked me to contact a priest for last rites. Contacted the local parish but Father Salvadore FarberBriant was out of town so awaiting another priest to notify me with availability. Chaplain Sonya A. Laws Ext. [phone]

## 2015-03-01 NOTE — Progress Notes (Signed)
eLink Physician-Brief Progress Note Patient Name: Roy ForehandBryan P Gordon DOB: 07-27-1975 MRN: [id number]   Date of Service  03/01/2015  HPI/Events of Note  RN/pharm called - fent/versed gtt orders have expired. aPatient noted to have seizures earlier  eICU Interventions  reordreed fent and versed gtt Continue diprivan      Intervention Category Major Interventions: Other:  Roy Gordon 03/01/2015, 1:45 AM

## 2015-03-01 NOTE — Progress Notes (Signed)
Patients ventilator was alarming due to elevated respiratory effort. He also had a moderate amount of yellow looking secretions on washcloth that was placed under his chin for excessive secretions. Patient has been unresponsive with no cough or gag reflex. Will continue to assess and monitor.

## 2015-03-01 NOTE — Progress Notes (Signed)
eLink Physician-Brief Progress Note Patient Name: Roy ForehandBryan P Gordon DOB: [birth year] MRN: [id number]   Date of Service  03/01/2015  HPI/Events of Note  Ct head - diffuse anoxia  Sugars 180 per RN  eICU Interventions  Start phase 1 hyperglycemia protocll     Intervention Category Evaluation Type: Other  [name] 03/01/2015, 1:02 AM

## 2015-03-01 NOTE — Progress Notes (Signed)
eLink Physician-Brief Progress Note Patient Name: Roy Gordon DOB: 11-Sep-1975 MRN: [id number]   Date of Service  03/01/2015  HPI/Events of Note  Bloody aspirate from OG tube Still somewhat agitated, febrile> likely aspiration pneumonitis  eICU Interventions  Fentanyl 50mcg x1 dose Hold heparin cbc     Intervention Category Major Interventions: Hemorrhage - evaluation and management  Max FickleDouglas Roger Kettles 03/01/2015, 7:22 PM

## 2015-03-01 NOTE — Progress Notes (Signed)
eLink Physician-Brief Progress Note Patient Name: Roy Gordon DOB: 07-21-1975 MRN: [id number]   Date of Service  03/01/2015  HPI/Events of Note  RN has witnessed periodic jerking worrisome for seizure activity Still febrile to 102  eICU Interventions  Propofol EEG in am (already ordered) Cooling blanket     Intervention Category Major Interventions: Seizures - evaluation and management  Max FickleDouglas Ajdin Macke 03/01/2015, 10:12 PM

## 2015-03-01 NOTE — Progress Notes (Signed)
ANTICOAGULATION CONSULT NOTE - Initial Consult  Pharmacy Consult for heparin Indication: chest pain/ACS  No Known Allergies  Patient Measurements: Height:  (177.8 cm) Weight: 150 lb (68.04 kg) IBW/kg (Calculated) : 73 Heparin Dosing Weight: 68 kg  Vital Signs: Temp: 99 F (37.2 C) (12/25 0300) BP: 126/77 mmHg (12/25 0300) Pulse Rate: 88 (12/25 0300)  Labs:  Recent Labs  02/27/15 0220 02/27/15 0442  02/27/15 0743 02/27/15 1122  02/27/15 1747  02/28/15 0034 02/28/15 0453 02/28/15 1120 02/28/15 1930 03/01/15 0327  HGB 16.5 16.7  --   --   --   --   --   --   --  15.5  --   --  13.9  HCT 49.0 47.4  --   --   --   --   --   --   --  45.2  --   --  40.4  PLT 162 154  --   --   --   --   --   --   --  142*  --   --  194  APTT 32  --   --  75*  --   --   --   --   --   --   --   --   --   LABPROT 15.3*  --   --  15.3*  --   --   --   --   --   --   --   --   --   INR 1.19  --   --  1.19  --   --   --   --   --   --   --   --   --   HEPARINUNFRC  --   --   --   --   --   < >  --   < >  --  0.71* 0.21* 0.11* 0.29*  CREATININE 1.11  --   < > 1.48*  --   < > 0.89  < > 0.80 0.91  --   --  1.11  TROPONINI 31.27* 32.94*  --   --  27.11*  --  14.90*  --   --   --   --   --   --   < > = values in this interval not displayed.  Estimated Creatinine Clearance: 85.9 mL/min (by C-G formula based on Cr of 1.11).   Medical History: Past Medical History  Diagnosis Date  . Hepatitis C   . 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 Coronary artery disease   . Stroke (HCC)   . Depression     a. prior suicide attempts    Medications:  Infusions:  . dextrose 5% lactated ringers 75 mL/hr at 02/28/15 2233  . fentaNYL infusion INTRAVENOUS    . heparin 800 Units/hr (03/01/15 0113)  . midazolam (VERSED) infusion 7.5 mg/hr (03/01/15 0151)  . norepinephrine (LEVOPHED) Adult infusion 14 mcg/min (03/01/15 0450)  . propofol (DIPRIVAN) infusion 50 mcg/kg/min (03/01/15 0030)     Assessment: 39 yom cc ACS post drug over dose. Currently on hypothermia protocol and has received 5000 units subcutaneous x 2 doses.  HL= 0.79 Goal of Therapy:  Heparin level 0.3-0.7 units/ml Monitor platelets by anticoagulation protocol: Yes   Plan:  Heparin level subtherapeutic. Bolus 1000 units IV x 1 and increase rate to 900 units/hr. Will recheck level in 6 hours.   Carola Frost,  Pharm.D., BCPS Clinical Pharmacist 03/01/2015,5:02 AM

## 2015-03-01 NOTE — Progress Notes (Signed)
According to flowsheet, pt reached 37degrees celsius at approximately 15:00 on 02/28/15.  At 3:00AM  On 12/25 pt it was 12 hours since rewarming target was achieved. Pt's temp has remained stable.  According to protocol, [facility] device was stopped and pads drained. MD, Dr. Marchelle Gearingamaswamy, was notified concerning need to keep pt. On fentanyl, versed, and propofol due to seizure episodes noted on 12/24 during day shift.

## 2015-03-01 NOTE — Progress Notes (Signed)
Ventilator alarms were going off for elevated respiratory rate up to 53. He did cough up some yellow colored fluid and notified Okey Regalarol with [facility]. Tube feedings placed on hold and hooked up to suction. Patient remains unresponsive with no gag reflex. Placed on low intermittent suction and drained out 200 ml so far. Patient continues to have elevated respirations and his lung sounds are now coarse crackles. [facility] notified and will continue to assess and monitor.

## 2015-03-01 NOTE — Progress Notes (Signed)
ANTICOAGULATION CONSULT NOTE - Initial Consult  Pharmacy Consult for heparin Indication: chest pain/ACS  No Known Allergies  Patient Measurements: Height:  (177.8 cm) Weight: 150 lb (68.04 kg) IBW/kg (Calculated) : 73 Heparin Dosing Weight: 68 kg  Vital Signs: Temp: 97.7 F (36.5 C) (12/25 1215) Temp Source: Core (Comment) (12/25 1200) BP: 118/74 mmHg (12/25 1215) Pulse Rate: 87 (12/25 1215)  Labs:  Recent Labs  02/27/15 0220 02/27/15 0442  02/27/15 0743 02/27/15 1122  02/27/15 1747  02/28/15 0034 02/28/15 0453  02/28/15 1930 03/01/15 0327 03/01/15 1234  HGB 16.5 16.7  --   --   --   --   --   --   --  15.5  --   --  13.9  --   HCT 49.0 47.4  --   --   --   --   --   --   --  45.2  --   --  40.4  --   PLT 162 154  --   --   --   --   --   --   --  142*  --   --  194  --   APTT 32  --   --  75*  --   --   --   --   --   --   --   --   --   --   LABPROT 15.3*  --   --  15.3*  --   --   --   --   --   --   --   --   --   --   INR 1.19  --   --  1.19  --   --   --   --   --   --   --   --   --   --   HEPARINUNFRC  --   --   --   --   --   < >  --   < >  --  0.71*  < > 0.11* 0.29* 0.44  CREATININE 1.11  --   < > 1.48*  --   < > 0.89  < > 0.80 0.91  --   --  1.11  --   TROPONINI 31.27* 32.94*  --   --  27.11*  --  14.90*  --   --   --   --   --   --   --   < > = values in this interval not displayed.  Estimated Creatinine Clearance: 85.9 mL/min (by C-G formula based on Cr of 1.11).   Medical History: Past Medical History  Diagnosis Date  . Hepatitis C   . 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 Coronary artery disease   . Stroke (HCC)   . Depression     a. prior suicide attempts    Medications:  Infusions:  . heparin 900 Units/hr (03/01/15 0527)  . norepinephrine (LEVOPHED) Adult infusion 13 mcg/min (03/01/15 1243)  . propofol (DIPRIVAN) infusion      Assessment: 39 yom cc ACS post drug over dose. Currently on hypothermia protocol and has received  5000 units subcutaneous x 2 doses.  HL= 0.44 Goal of Therapy:  Heparin level 0.3-0.7 units/ml Monitor platelets by anticoagulation protocol: Yes   Plan:  Heparin level is at goal. Bolus Will continue heparin drip at 900 units/hr. Will recheck level in 6 hours.  Luisa Harthristy, Alekhya Gravlin D, Pharm.D., BCPS Clinical Pharmacist 03/01/2015,1:20 PM

## 2015-03-01 NOTE — Progress Notes (Signed)
ANTICOAGULATION CONSULT NOTE - Follow up Consult  Pharmacy Consult for heparin Indication: chest pain/ACS  No Known Allergies  Patient Measurements: Height: 5\' 10"  (177.8 cm) Weight: 150 lb (68.04 kg) IBW/kg (Calculated) : 73 Heparin Dosing Weight: 68 kg  Vital Signs: Temp: 102.4 F (39.1 C) (12/25 1845) Temp Source: Core (Comment) (12/25 1600) BP: 118/71 mmHg (12/25 1845) Pulse Rate: 104 (12/25 1845)  Labs:  Recent Labs  02/27/15 0220 02/27/15 0442  02/27/15 0743 02/27/15 1122  02/27/15 1747  02/28/15 0034 02/28/15 0453  03/01/15 0327 03/01/15 1234 03/01/15 1803  HGB 16.5 16.7  --   --   --   --   --   --   --  15.5  --  13.9  --   --   HCT 49.0 47.4  --   --   --   --   --   --   --  45.2  --  40.4  --   --   PLT 162 154  --   --   --   --   --   --   --  142*  --  194  --   --   APTT 32  --   --  75*  --   --   --   --   --   --   --   --   --   --   LABPROT 15.3*  --   --  15.3*  --   --   --   --   --   --   --   --   --   --   INR 1.19  --   --  1.19  --   --   --   --   --   --   --   --   --   --   HEPARINUNFRC  --   --   --   --   --   < >  --   < >  --  0.71*  < > 0.29* 0.44 0.19*  CREATININE 1.11  --   < > 1.48*  --   < > 0.89  < > 0.80 0.91  --  1.11  --   --   TROPONINI 31.27* 32.94*  --   --  27.11*  --  14.90*  --   --   --   --   --   --   --   < > = values in this interval not displayed.  Estimated Creatinine Clearance: 85.9 mL/min (by C-G formula based on Cr of 1.11).   Medical History: Past Medical History  Diagnosis Date  . Hepatitis C   . 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 Coronary artery disease   . Stroke (HCC)   . Depression     a. prior suicide attempts    Medications:  Infusions:  . heparin    . norepinephrine (LEVOPHED) Adult infusion 9 mcg/min (03/01/15 1813)  . propofol (DIPRIVAN) infusion      Assessment: 39 yom cc ACS post drug over dose. Currently on hypothermia protocol and has received 5000 units subcutaneous  x 2 doses.  HL= 0.44 Goal of Therapy:  Heparin level 0.3-0.7 units/ml Monitor platelets by anticoagulation protocol: Yes   Plan:  Heparin level is at goal. Bolus Will continue heparin drip at 900 units/hr. Will recheck level in 6 hours.   12/25  HL @ 1800 subtherapeutic at 0.19 on current infusion rate of 900 units/hr. Gave bolus of 2040 units (30 units/kg) and increased rate to 1100 units/hr (~16 units/kg/hr). Ordered heparin level in 6 hours and CBC with AM labs tomorrow.   Pharmacy will continue to monitor, thank you for the consult.  Cindi Carbon, Pharm.D. Clinical Pharmacist 03/01/2015,7:09 PM

## 2015-03-01 NOTE — Progress Notes (Signed)
[facility] [place] at [facility]                                                                                                                                                                                            Patient Demographics   Roy Gordon, is a 39 y.o. male, DOB - [birth year], WUJ:[id number]  Admit date - 02/17/2015   Admitting Physician Oralia Manis, MD  Outpatient Primary MD for the patient is No primary care provider on file.   LOS - 3  Subjective:  Patient remains intubated. CT scan of the head repeated shows findings consistent with anoxic brain injury.  Review of Systems:   CONSTITUTIONAL: Unable to provide due him being intubated    Vitals:   Filed Vitals:   03/01/15 0930 03/01/15 0945 03/01/15 1000 03/01/15 1015  BP: 132/85 120/72 110/64 104/62  Pulse: 84 83 82 83  Temp: 97.7 F (36.5 C) 97.5 F (36.4 C) 97.3 F (36.3 C) 97.3 F (36.3 C)  TempSrc:      Resp: 14 14 14 15   Height:      Weight:      SpO2: 98% 99% 97% 98%    Wt Readings from Last 3 Encounters:  03/02/2015 68.04 kg (150 lb)     Intake/Output Summary (Last 24 hours) at 03/01/15 1234 Last data filed at 03/01/15 1000  Gross per 24 hour  Intake 3730.77 ml  Output   1000 ml  Net 2730.77 ml    Physical Exam:   GENERAL: Critically ill HEAD, EYES, EARS, NOSE AND THROAT: Atraumatic, normocephalic.. Pupils equal and reactive to light. Sclerae anicteric. No conjunctival injection. No oro-pharyngeal erythema.  NECK: Supple. There is no jugular venous distention. No bruits, no lymphadenopathy, no thyromegaly.  HEART: Regular rate and rhythm,. No murmurs, no rubs, no clicks.  LUNGS: Clear to auscultation bilaterally. No rales or rhonchi. No wheezes.  ABDOMEN: Soft, flat, nontender, nondistended. Has good bowel sounds. No hepatosplenomegaly appreciated.  EXTREMITIES: No evidence of any cyanosis, clubbing, or peripheral edema.  +2 pedal and radial pulses  bilaterally.  NEUROLOGIC: Poorly responsive on the ventilator  SKIN: Moist and warm with no rashes appreciated.  Psych: Intubated LN: No inguinal LN enlargement    Antibiotics   Anti-infectives    Start     Dose/Rate Route Frequency Ordered Stop   02/27/15 1100  vancomycin (VANCOCIN) 50 mg/mL oral solution 125 mg     125 mg Per Tube 4 times daily 02/27/15 1057 03/13/15 0959      Medications   Scheduled Meds: . antiseptic oral rinse  7 mL Mouth Rinse 10  times per day  . artificial tears  1 application Both Eyes 3 times per day  . aspirin  325 mg Per Tube Daily  . chlorhexidine gluconate  15 mL Mouth Rinse BID  . feeding supplement (VITAL HIGH PROTEIN)  1,000 mL Per Tube Q24H  . free water  100 mL Per Tube 3 times per day  . levETIRAcetam  1,000 mg Intravenous Q12H  . pantoprazole sodium  40 mg Per Tube Q1200  . potassium chloride  40 mEq Per Tube BID  . vancomycin  125 mg Per Tube QID   Continuous Infusions: . heparin 900 Units/hr (03/01/15 0527)  . norepinephrine (LEVOPHED) Adult infusion 14 mcg/min (03/01/15 1139)  . propofol (DIPRIVAN) infusion     PRN Meds:.   Data Review:   Micro Results Recent Results (from the past 240 hour(s))  C difficile quick scan w PCR reflex     Status: Abnormal   Collection Time: 02/27/15  2:26 AM  Result Value Ref Range Status   C Diff antigen POSITIVE (A) NEGATIVE Final   C Diff toxin NEGATIVE NEGATIVE Final   C Diff interpretation   Final    Positive for toxigenic C. difficile, active toxin production not detected. Patient has toxigenic C. difficile organisms present in the bowel, but toxin was not detected. The patient may be a carrier or the level of toxin in the sample was below the limit  of detection. This information should be used in conjunction with the patient's clinical history when deciding on possible therapy.   MRSA PCR Screening     Status: None   Collection Time: 02/27/15  2:26 AM  Result Value Ref Range Status    MRSA by PCR NEGATIVE NEGATIVE Final    Comment:        The GeneXpert MRSA Assay (FDA approved for NASAL specimens only), is one component of a comprehensive MRSA colonization surveillance program. It is not intended to diagnose MRSA infection nor to guide or monitor treatment for MRSA infections.   Clostridium Difficile by PCR     Status: Abnormal   Collection Time: 02/27/15  2:26 AM  Result Value Ref Range Status   Toxigenic C Difficile by pcr POSITIVE (A) NEGATIVE Final    Comment: CRITICAL RESULT CALLED TO, READ BACK BY AND VERIFIED WITH: MONIQUE ROBERSON 02/27/15 0431 SJL     Radiology Reports Dg Chest 1 View  02/27/2015  CLINICAL DATA:  Central line placement EXAM: CHEST 1 VIEW COMPARISON:  Yesterday FINDINGS: New right IJ line, tip at the upper cavoatrial junction. There is no pneumothorax or new mediastinal widening. Unchanged positioning of endotracheal and orogastric tubes. There is no edema, consolidation, effusion, or pneumothorax. Normal heart size and mediastinal contours. IMPRESSION: 1. New right IJ line without complicating feature. 2. Lungs remain clear. Electronically Signed   By: Marnee Spring M.D.   On: 02/27/2015 01:57   Ct Head Wo Contrast  02/28/2015  CLINICAL DATA:  Heroin overdose with seizures. Possible anoxic brain injury. EXAM: CT HEAD WITHOUT CONTRAST TECHNIQUE: Contiguous axial images were obtained from the base of the skull through the vertex without intravenous contrast. COMPARISON:  Head CT 02/06/2015 and 12/09/2011. FINDINGS: There is progressive loss of gray-white differentiation throughout the cerebrum consistent with progressive diffuse cerebral edema. There is partial effacement of the subarachnoid spaces. No midline shift, hydrocephalus, extra-axial fluid collection or focal infarct demonstrated. The cerebellum remains fairly normal in appearance. The visualized paranasal sinuses, mastoid air cells and middle ears are clear. The  calvarium is intact.  IMPRESSION: Progressive generalized loss of gray-white differentiation throughout the cerebrum and effacement of the subarachnoid spaces consistent with diffuse cerebral edema. Findings remain consistent with global anoxic injury. Electronically Signed   By: Carey BullocksWilliam  Veazey M.D.   On: 02/28/2015 14:55   Ct Head Wo Contrast  02/20/2015  CLINICAL DATA:  Witnessed cardiac arrest.  Unresponsive. EXAM: CT HEAD WITHOUT CONTRAST TECHNIQUE: Contiguous axial images were obtained from the base of the skull through the vertex without intravenous contrast. COMPARISON:  12/09/2011 FINDINGS: Skull and Sinuses:No acute or traumatic finding Visualized orbits: Negative. Brain: There is blurred gray-white differentiation at the cortex, without discrete infarct, hemorrhage, hydrocephalus, or herniation. Deep gray nuclei are still distinct. High-density appearance of the major intracranial veins is likely related to brain edema. There is no historical findings to suggest primary venous occlusive disease. These results were called by telephone at the time of interpretation on 02/07/2015 at 10:44 pm to Dr. Scotty CourtStafford, who verbally acknowledged these results. 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 Electronically Signed   By: Marnee SpringJonathon  Watts M.D.   On: 02/25/2015 22:50   Dg Chest Port 1 View  03/01/2015  CLINICAL DATA:  39 year old male with acute respiratory failure secondary to drug overdose EXAM: PORTABLE CHEST 1 VIEW COMPARISON:  Prior chest x-ray 02/28/2015 FINDINGS: The patient is intubated. The tip of the endotracheal tube is 4 cm above the carina. There is a right IJ approach central venous catheter the tip of which appears well positioned overlying the superior cavoatrial junction. A gastric tube is present. The proximal side hole overlies the gastric fundus. Cardiac and mediastinal contours are within normal limits. There may be minimal right basilar atelectasis. Otherwise, the lungs are clear. No  acute osseous abnormality. IMPRESSION: 1. Stable and satisfactory support apparatus. 2. Perhaps mild right basilar atelectasis. Electronically Signed   By: Malachy MoanHeath  McCullough M.D.   On: 03/01/2015 09:43   Dg Chest Port 1 View  02/28/2015  CLINICAL DATA:  Respiratory failure EXAM: PORTABLE CHEST 1 VIEW COMPARISON:  None. FINDINGS: Endotracheal tube, NG tube, and RIGHT central venous line are unchanged. Stable cardiac silhouette. No effusion, infiltrate, pneumothorax. IMPRESSION: 1. Support apparatus unchanged. 2. No acute cardiopulmonary findings. Electronically Signed   By: Genevive BiStewart  Edmunds M.D.   On: 02/28/2015 07:28   Dg Chest Portable 1 View  02/24/2015  CLINICAL DATA:  EMS called. Patient on floor of bathroom unresponsive. Patient's girlfriend told EMS that patient has been using heroin today. [facility] [place]rest was witnessed PE[facility]. Defib x 1 for VTach. EXAM: PORTABLE CHEST 1 VIEW COMPARISON:  05/05/2013 FINDINGS: Lungs are clear. No pleural effusion or gross pneumothorax. Heart, mediastinum hila are unremarkable. Endotracheal tube tip projects 3.5 cm above the carina. Orogastric tube passes below the diaphragm. IMPRESSION: 1. No acute cardiopulmonary disease. 2. Support apparatus is well positioned. Electronically Signed   By: Amie Portlandavid  Ormond M.D.   On: 02/12/2015 20:52     CBC  Recent Labs Lab 03/05/2015 2020 02/27/15 0220 02/27/15 0442 02/28/15 0453 03/01/15 0327  WBC 24.2* 23.0* 21.0* 15.8* 18.8*  HGB 15.3 16.5 16.7 15.5 13.9  HCT 45.8 49.0 47.4 45.2 40.4  PLT 232 162 154 142* 194  MCV 91.1 87.7 88.6 89.1 87.8  MCH 30.4 29.6 31.3 30.6 30.2  MCHC 33.3 33.7 35.3 34.4 34.4  RDW 13.6 13.3 13.1 13.3 13.5  LYMPHSABS 7.9*  --   --   --   --  MONOABS 0.7  --   --   --   --   EOSABS 0.2  --   --   --   --   BASOSABS 0.1  --   --   --   --     Chemistries   Recent Labs Lab 03-08-2015 2020  02/27/15 1747 02/27/15 2108 02/28/15 0034 02/28/15 0453  03/01/15 0327  NA 136  < > 139 138 140 140 138  K 4.5  < > 3.6 3.9 3.7 3.6 3.3*  CL 102  < > 114* 113* 113* 112* 112*  CO2 16*  < > 18* 17* 20* 19* 22  GLUCOSE 319*  < > 120* 104* 92 95 148*  BUN 19  < > CREATININE 1.73*  < > 0.89 0.81 0.80 0.91 1.11  CALCIUM 8.2*  < > 8.2* 8.3* 8.5* 8.4* 8.1*  AST 70*  --   --   --   --  140* 115*  ALT 69*  --   --   --   --  94* 77*  ALKPHOS 102  --   --   --   --  68 63  BILITOT 0.8  --   --   --   --  1.0 0.7  < > = values in this interval not displayed. ------------------------------------------------------------------------------------------------------------------ estimated creatinine clearance is 85.9 mL/min (by C-G formula based on Cr of 1.11). ------------------------------------------------------------------------------------------------------------------ No results for input(s): HGBA1C in the last 72 hours. ------------------------------------------------------------------------------------------------------------------  Recent Labs  02/28/15 1120  TRIG 133   ------------------------------------------------------------------------------------------------------------------ No results for input(s): TSH, T4TOTAL, T3FREE, THYROIDAB in the last 72 hours.  Invalid input(s): FREET3 ------------------------------------------------------------------------------------------------------------------ No results for input(s): VITAMINB12, FOLATE, FERRITIN, TIBC, IRON, RETICCTPCT in the last 72 hours.  Coagulation profile  Recent Labs Lab 02/27/15 0220 02/27/15 0743  INR 1.19 1.19    No results for input(s): DDIMER in the last 72 hours.  Cardiac Enzymes  Recent Labs Lab 02/27/15 0442 02/27/15 1122 02/27/15 1747  TROPONINI 32.94* 27.11* 14.90*   ------------------------------------------------------------------------------------------------------------------ Invalid input(s): POCBNP    Assessment & Plan    Principal Problem: 1.  Cardiac arrest (HCC) - secondary to drug overdose. Continue supportive care , Prognosis very poor. Likely severe anoxic brain injury based on the CT scan findings on admission and yesterday.  I have discussed case with neurology they will evaluate him tomorrow to give [place] further prognostic idea about his recovery.  2.  Drug overdose - tox screen positive for opiates and PCP.  Substance abuse counseling if he survives this incident. 3. Acute respiratory failure with hypoxia (HCC) - intubated, stable with mechanical ventilation at this time. Continue to monitor. 4. Acute renal insufficiency resolved with IV fluid 5. Elevated troponin suspect due to cardiac arrest  appreciate cardiology input continue IV heparin  6. Seizure continue Keppra, versed and diprivan, neuro input for prognosis as well,     Code Status Orders        Start     Ordered   02/27/15 0201  Full code   Continuous     02/27/15 0200           Consults  pulmonary critical care, neurology   DVT ProphylaxisHeparin   Lab Results  Component Value Date   PLT 194 03/01/2015     Time Spent in minutes 35 minutes of critical care time spent   Auburn Bilberry M.D on 03/01/2015 at 12:34 PM  Between 7am to 6pm - Pager -  [phone]  After 6pm go to [email or web address] - password [email or web address] [place] [facility]   Office  [phone]

## 2015-03-01 NOTE — Progress Notes (Signed)
eLink Physician-Brief Progress Note Patient Name: Roy ForehandBryan P Gordon DOB: 1975/04/05 MRN: [id number]   Date of Service  03/01/2015  HPI/Events of Note  Fever in setting of recent cardiac arrest, likely anoxic brain injury  eICU Interventions  Acetaminophen prn     Intervention Category Intermediate Interventions: Other:  Max FickleDouglas McQuaid 03/01/2015, 7:39 PM

## 2015-03-01 NOTE — Progress Notes (Signed)
[facility] and while at bedside rounds patient had jerking motions with bright red blood coming from NG tube. Patient appeared to have some posturing noted to bilateral arms. Heart rate was up to 140's, respiratory rate was between high 30-40's, systolic blood pressure was up to high 160's and he is now running a temperature up to 102. Elink notified and Okey RegalCarol stated that Dr. Kendrick FriesMcQuaid would be notified. Will continue to assess and monitor.

## 2015-03-02 ENCOUNTER — Inpatient Hospital Stay: Payer: Medicaid Other

## 2015-03-02 DIAGNOSIS — G40901 Epilepsy, unspecified, not intractable, with status epilepticus: Secondary | ICD-10-CM

## 2015-03-02 LAB — BASIC METABOLIC PANEL
ANION GAP: 7 (ref 5–15)
BUN: 7 mg/dL (ref 6–20)
CALCIUM: 8.2 mg/dL — AB (ref 8.9–10.3)
CO2: 24 mmol/L (ref 22–32)
CREATININE: 0.84 mg/dL (ref 0.61–1.24)
Chloride: 110 mmol/L (ref 101–111)
Glucose, Bld: 118 mg/dL — ABNORMAL HIGH (ref 65–99)
Potassium: 3.9 mmol/L (ref 3.5–5.1)
SODIUM: 141 mmol/L (ref 135–145)

## 2015-03-02 LAB — CBC
HCT: 36.3 % — ABNORMAL LOW (ref 40.0–52.0)
HEMOGLOBIN: 12.7 g/dL — AB (ref 13.0–18.0)
MCH: 30.5 pg (ref 26.0–34.0)
MCHC: 35 g/dL (ref 32.0–36.0)
MCV: 86.9 fL (ref 80.0–100.0)
PLATELETS: 135 10*3/uL — AB (ref 150–440)
RBC: 4.18 MIL/uL — AB (ref 4.40–5.90)
RDW: 13.4 % (ref 11.5–14.5)
WBC: 14.6 10*3/uL — AB (ref 3.8–10.6)

## 2015-03-02 LAB — GLUCOSE, CAPILLARY
GLUCOSE-CAPILLARY: 118 mg/dL — AB (ref 65–99)
GLUCOSE-CAPILLARY: 89 mg/dL (ref 65–99)
Glucose-Capillary: 109 mg/dL — ABNORMAL HIGH (ref 65–99)
Glucose-Capillary: 105 mg/dL — ABNORMAL HIGH (ref 65–99)
Glucose-Capillary: 129 mg/dL — ABNORMAL HIGH (ref 65–99)

## 2015-03-02 MED ORDER — SODIUM CHLORIDE 0.9 % IV SOLN
1500.0000 mg | Freq: Three times a day (TID) | INTRAVENOUS | Status: DC
  Administered 2015-03-02 – 2015-03-03 (×3): 1500 mg via INTRAVENOUS
  Filled 2015-03-02 (×5): qty 15

## 2015-03-02 MED ORDER — VANCOMYCIN HCL 10 G IV SOLR
1250.0000 mg | Freq: Two times a day (BID) | INTRAVENOUS | Status: DC
  Administered 2015-03-02 – 2015-03-03 (×2): 1250 mg via INTRAVENOUS
  Filled 2015-03-02 (×3): qty 1250

## 2015-03-02 MED ORDER — DEXTROSE 5 % IV SOLN
2.0000 g | Freq: Three times a day (TID) | INTRAVENOUS | Status: DC
  Administered 2015-03-02 – 2015-03-03 (×3): 2 g via INTRAVENOUS
  Filled 2015-03-02 (×5): qty 2

## 2015-03-02 MED ORDER — VITAL HIGH PROTEIN PO LIQD
1000.0000 mL | ORAL | Status: DC
  Administered 2015-03-02: 1000 mL

## 2015-03-02 MED ORDER — SODIUM CHLORIDE 0.9 % IV SOLN
1250.0000 mg | Freq: Once | INTRAVENOUS | Status: AC
  Administered 2015-03-02: 1250 mg via INTRAVENOUS
  Filled 2015-03-02: qty 1250

## 2015-03-02 NOTE — Progress Notes (Signed)
Clinical Child psychotherapistocial Worker (CSW) received consult for addressing advanced directive and family concerns. CSW reviewed chart and noted that patient is intubated and physicians are recommending the family to withdraw care. CSW attempted to discuss case with RN however she was in another patient's room. Patient had several visitors in the room this afternoon. CSW will attempt to meet with RN tomorrow to discuss case.   Jetta LoutBailey Morgan, LCSWA [phone]

## 2015-03-02 NOTE — Progress Notes (Signed)
Nutrition Follow-up     INTERVENTION:  EN: Dr. Sung AmabileSimonds wanting to start trophic feeding of vital high protein at this time (5520ml/hr).  Will reassess goal rate in am pending tolerance and poc.   NUTRITION DIAGNOSIS:   Inadequate oral intake related to acute illness, inability to eat as evidenced by NPO status.    GOAL:   Provide needs based on ASPEN/SCCM guidelines    MONITOR:    (Energy Intake, Anthropometrics, Digestive System, Electrolyte/Renal Profile, Glucose Profile, Pulmonary)  REASON FOR ASSESSMENT:   Consult Enteral/tube feeding initiation and management  ASSESSMENT:      Pt rewarmed, continues on vent.  Had episode of seizure like activity yesterday, Neurology consult pending.  Father made pt DNR today.     Current Nutrition: Tube feeding started yesterday of vital high protein stopped due to bloody secretions from OG tube (heparin discontinued)   Gastrointestinal Profile: Last BM: rectal tube   Scheduled Medications:  . antiseptic oral rinse  7 mL Mouth Rinse 10 times per day  . artificial tears  1 application Both Eyes 3 times per day  . aspirin  325 mg Per Tube Daily  . cefTAZidime (FORTAZ)  IV  2 g Intravenous 3 times per day  . chlorhexidine gluconate  15 mL Mouth Rinse BID  . feeding supplement (VITAL HIGH PROTEIN)  1,000 mL Per Tube Q24H  . free water  100 mL Per Tube 3 times per day  . levETIRAcetam  1,500 mg Intravenous 3 times per day  . pantoprazole sodium  40 mg Per Tube Q1200  . vancomycin  1,250 mg Intravenous Once  . vancomycin  1,250 mg Intravenous Q12H  . vancomycin  125 mg Per Tube QID    Continuous Medications:  . norepinephrine (LEVOPHED) Adult infusion 7 mcg/min (03/02/15 0600)  . propofol (DIPRIVAN) infusion 30 mcg/kg/min (03/02/15 0556)     Electrolyte/Renal Profile and Glucose Profile:   Recent Labs Lab 02/28/15 0453 03/01/15 0327 03/02/15 0523  NA 140 138 141  K 3.6 3.3* 3.9  CL 112* 112* 110  CO2 19* 22 24   BUN 15 8 7   CREATININE 0.91 1.11 0.84  CALCIUM 8.4* 8.1* 8.2*  GLUCOSE 95 148* 118*   Protein Profile:   Recent Labs Lab 01/01/15 2020 02/28/15 0453 03/01/15 0327  ALBUMIN 3.8 3.5 3.1*      Weight Trend since Admission: Filed Weights   01/01/15 2014  Weight: 150 lb (68.04 kg)      Diet Order:   NPO  Skin:  Reviewed, no issues   Height:   Ht Readings from Last 1 Encounters:  01/01/15 5\' 10"  (1.778 m)    Weight:   Wt Readings from Last 1 Encounters:  01/01/15 150 lb (68.04 kg)    Ideal Body Weight:     BMI:  Body mass index is 21.52 kg/(m^2).  Estimated Nutritional Needs:   Kcal:  TEE 1922 kcals (Ve 9.2, Tmax 37.8)  Protein:  102-136 g (1.5-2.0 g/kg)  Fluid:  1700-204140ml (25-2930ml/kg)   EDUCATION NEEDS:   No education needs identified at this time  HIGH Care Level  Obdulio Mash B. Freida BusmanAllen, RD, LDN [phone] (pager) Weekend/On-Call pager [phone])

## 2015-03-02 NOTE — Progress Notes (Signed)
[facility] [place] [facility]                                                                                                                                                                                            Patient Demographics   Roy LopeBryan Gordon, is a 39 y.o. male, DOB - June 27, 1975, ZOX:[id number]  Admit date - 03-13-2014   Admitting Physician Oralia Manisavid Willis, MD  Outpatient Primary MD for the patient is No primary care provider on file.   LOS - 4  Subjective:   Remains intubated chest x-ray suggestive of pneumonia now. Has fevers.  Review of Systems:   CONSTITUTIONAL: Unable to provide due him being intubated    Vitals:   Filed Vitals:   03/02/15 1000 03/02/15 1100 03/02/15 1200 03/02/15 1300  BP: 104/59 102/59 109/59 112/65  Pulse: 95 95 93 93  Temp: 99.7 F (37.6 C) 99.9 F (37.7 C) 100 F (37.8 C) 100 F (37.8 C)  TempSrc:      Resp: 19 21 21 22   Height:      Weight:      SpO2: 95% 96% 97% 97%    Wt Readings from Last 3 Encounters:  May 20, 2014 68.04 kg (150 lb)     Intake/Output Summary (Last 24 hours) at 03/02/15 1354 Last data filed at 03/02/15 1300  Gross per 24 hour  Intake 1256.13 ml  Output   1800 ml  Net -543.87 ml    Physical Exam:   GENERAL: Critically ill HEAD, EYES, EARS, NOSE AND THROAT: Atraumatic, normocephalic.. Pupils equal and reactive to light. Sclerae anicteric. No conjunctival injection. No oro-pharyngeal erythema.  NECK: Supple. There is no jugular venous distention. No bruits, no lymphadenopathy, no thyromegaly.  HEART: Regular rate and rhythm,. No murmurs, no rubs, no clicks.  LUNGS: Clear to auscultation bilaterally. No rales or rhonchi. No wheezes. On the vent ABDOMEN: Soft, flat, nontender, nondistended. Has good bowel sounds. No hepatosplenomegaly appreciated.  EXTREMITIES: No evidence of any cyanosis, clubbing, or peripheral edema.  +2 pedal and radial pulses bilaterally.  NEUROLOGIC: Poorly  responsive on the ventilator  SKIN: Moist and warm with no rashes appreciated.  Psych: Intubated LN: No inguinal LN enlargement    Antibiotics   Anti-infectives    Start     Dose/Rate Route Frequency Ordered Stop   03/02/15 1800  vancomycin (VANCOCIN) 1,250 mg in sodium chloride 0.9 % 250 mL IVPB     1,250 mg 166.7 mL/hr over 90 Minutes Intravenous Every 12 hours 03/02/15 1138     03/02/15 1400  cefTAZidime (FORTAZ) 2 g in dextrose 5 % 50 mL  IVPB     2 g 100 mL/hr over 30 Minutes Intravenous 3 times per day 03/02/15 1140     03/02/15 1200  vancomycin (VANCOCIN) 1,250 mg in sodium chloride 0.9 % 250 mL IVPB     1,250 mg 166.7 mL/hr over 90 Minutes Intravenous  Once 03/02/15 1138     02/27/15 1100  vancomycin (VANCOCIN) 50 mg/mL oral solution 125 mg     125 mg Per Tube 4 times daily 02/27/15 1057 03/13/15 0959      Medications   Scheduled Meds: . antiseptic oral rinse  7 mL Mouth Rinse 10 times per day  . artificial tears  1 application Both Eyes 3 times per day  . aspirin  325 mg Per Tube Daily  . cefTAZidime (FORTAZ)  IV  2 g Intravenous 3 times per day  . chlorhexidine gluconate  15 mL Mouth Rinse BID  . feeding supplement (VITAL HIGH PROTEIN)  1,000 mL Per Tube Q24H  . free water  100 mL Per Tube 3 times per day  . levETIRAcetam  1,500 mg Intravenous 3 times per day  . pantoprazole sodium  40 mg Per Tube Q1200  . vancomycin  1,250 mg Intravenous Once  . vancomycin  1,250 mg Intravenous Q12H  . vancomycin  125 mg Per Tube QID   Continuous Infusions: . norepinephrine (LEVOPHED) Adult infusion 7 mcg/min (03/02/15 0600)  . propofol (DIPRIVAN) infusion 30 mcg/kg/min (03/02/15 0556)   PRN Meds:.   Data Review:   Micro Results Recent Results (from the past 240 hour(s))  C difficile quick scan w PCR reflex     Status: Abnormal   Collection Time: 02/27/15  2:26 AM  Result Value Ref Range Status   C Diff antigen POSITIVE (A) NEGATIVE Final   C Diff toxin NEGATIVE  NEGATIVE Final   C Diff interpretation   Final    Positive for toxigenic C. difficile, active toxin production not detected. Patient has toxigenic C. difficile organisms present in the bowel, but toxin was not detected. The patient may be a carrier or the level of toxin in the sample was below the limit  of detection. This information should be used in conjunction with the patient's clinical history when deciding on possible therapy.   MRSA PCR Screening     Status: None   Collection Time: 02/27/15  2:26 AM  Result Value Ref Range Status   MRSA by PCR NEGATIVE NEGATIVE Final    Comment:        The GeneXpert MRSA Assay (FDA approved for NASAL specimens only), is one component of a comprehensive MRSA colonization surveillance program. It is not intended to diagnose MRSA infection nor to guide or monitor treatment for MRSA infections.   Clostridium Difficile by PCR     Status: Abnormal   Collection Time: 02/27/15  2:26 AM  Result Value Ref Range Status   Toxigenic C Difficile by pcr POSITIVE (A) NEGATIVE Final    Comment: CRITICAL RESULT CALLED TO, READ BACK BY AND VERIFIED WITH: MONIQUE ROBERSON 02/27/15 0431 SJL     Radiology Reports Dg Chest 1 View  02/27/2015  CLINICAL DATA:  Central line placement EXAM: CHEST 1 VIEW COMPARISON:  Yesterday FINDINGS: New right IJ line, tip at the upper cavoatrial junction. There is no pneumothorax or new mediastinal widening. Unchanged positioning of endotracheal and orogastric tubes. There is no edema, consolidation, effusion, or pneumothorax. Normal heart size and mediastinal contours. IMPRESSION: 1. New right IJ line without complicating feature. 2. Lungs  remain clear. Electronically Signed   By: Marnee Spring M.D.   On: 02/27/2015 01:57   Ct Head Wo Contrast  02/28/2015  CLINICAL DATA:  Heroin overdose with seizures. Possible anoxic brain injury. EXAM: CT HEAD WITHOUT CONTRAST TECHNIQUE: Contiguous axial images were obtained from the base of  the skull through the vertex without intravenous contrast. COMPARISON:  Head CT March 27, 2015 and 12/09/2011. FINDINGS: There is progressive loss of gray-white differentiation throughout the cerebrum consistent with progressive diffuse cerebral edema. There is partial effacement of the subarachnoid spaces. No midline shift, hydrocephalus, extra-axial fluid collection or focal infarct demonstrated. The cerebellum remains fairly normal in appearance. The visualized paranasal sinuses, mastoid air cells and middle ears are clear. The calvarium is intact. IMPRESSION: Progressive generalized loss of gray-white differentiation throughout the cerebrum and effacement of the subarachnoid spaces consistent with diffuse cerebral edema. Findings remain consistent with global anoxic injury. Electronically Signed   By: Carey Bullocks M.D.   On: 02/28/2015 14:55   Ct Head Wo Contrast  03-27-2015  CLINICAL DATA:  Witnessed cardiac arrest.  Unresponsive. EXAM: CT HEAD WITHOUT CONTRAST TECHNIQUE: Contiguous axial images were obtained from the base of the skull through the vertex without intravenous contrast. COMPARISON:  12/09/2011 FINDINGS: Skull and Sinuses:No acute or traumatic finding Visualized orbits: Negative. Brain: There is blurred gray-white differentiation at the cortex, without discrete infarct, hemorrhage, hydrocephalus, or herniation. Deep gray nuclei are still distinct. High-density appearance of the major intracranial veins is likely related to brain edema. There is no historical findings to suggest primary venous occlusive disease. These results were called by telephone at the time of interpretation on 03/27/15 at 10:44 pm to Dr. Scotty Court, who verbally acknowledged these results. 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 Electronically Signed   By: Marnee Spring M.D.   On: Mar 27, 2015 22:50   Dg Chest Port 1 View  03/02/2015  CLINICAL DATA:  Respiratory failure, ventilatory support EXAM:  PORTABLE CHEST 1 VIEW COMPARISON:  03/01/2015 FINDINGS: Endotracheal tube 5.1 cm above the carina. Right IJ central line tip lower SVC level. NG tube extends into the stomach with the tip not visualized. Exam is rotated to the left. Mild increased patchy bibasilar airspace process concerning for pneumonia or aspiration. No enlarging effusion or pneumothorax. Negative for edema. Normal heart size and vascularity. No abnormal osseous finding. IMPRESSION: Mild worsening bibasilar airspace process concerning for pneumonia. Stable support apparatus. Electronically Signed   By: Judie Petit.  Shick M.D.   On: 03/02/2015 10:13   Dg Chest Port 1 View  03/01/2015  CLINICAL DATA:  39 year old male with acute respiratory failure secondary to drug overdose EXAM: PORTABLE CHEST 1 VIEW COMPARISON:  Prior chest x-ray 02/28/2015 FINDINGS: The patient is intubated. The tip of the endotracheal tube is 4 cm above the carina. There is a right IJ approach central venous catheter the tip of which appears well positioned overlying the superior cavoatrial junction. A gastric tube is present. The proximal side hole overlies the gastric fundus. Cardiac and mediastinal contours are within normal limits. There may be minimal right basilar atelectasis. Otherwise, the lungs are clear. No acute osseous abnormality. IMPRESSION: 1. Stable and satisfactory support apparatus. 2. Perhaps mild right basilar atelectasis. Electronically Signed   By: Malachy Moan M.D.   On: 03/01/2015 09:43   Dg Chest Port 1 View  02/28/2015  CLINICAL DATA:  Respiratory failure EXAM: PORTABLE CHEST 1 VIEW COMPARISON:  None. FINDINGS: Endotracheal tube, NG tube, and RIGHT central venous line are unchanged. Stable cardiac  silhouette. No effusion, infiltrate, pneumothorax. IMPRESSION: 1. Support apparatus unchanged. 2. No acute cardiopulmonary findings. Electronically Signed   By: Genevive Bi M.D.   On: 02/28/2015 07:28   Dg Chest Portable 1 View  27-Feb-2015   CLINICAL DATA:  EMS called. Patient on floor of bathroom unresponsive. Patient's girlfriend told EMS that patient has been using heroin today. Patient was found at [facility] in the [place]. Arrest was witnessed PEA. Defib x 1 for VTach. EXAM: PORTABLE CHEST 1 VIEW COMPARISON:  05/05/2013 FINDINGS: Lungs are clear. No pleural effusion or gross pneumothorax. Heart, mediastinum hila are unremarkable. Endotracheal tube tip projects 3.5 cm above the carina. Orogastric tube passes below the diaphragm. IMPRESSION: 1. No acute cardiopulmonary disease. 2. Support apparatus is well positioned. Electronically Signed   By: Amie Portland M.D.   On: 02-27-15 20:52     CBC  Recent Labs Lab 02/27/2015 2020  02/27/15 0442 02/28/15 0453 03/01/15 0327 03/01/15 2200 03/02/15 0523  WBC 24.2*  < > 21.0* 15.8* 18.8* 15.1* 14.6*  HGB 15.3  < > 16.7 15.5 13.9 13.6 12.7*  HCT 45.8  < > 47.4 45.2 40.4 39.0* 36.3*  PLT 232  < > 154 142* 194 147* 135*  MCV 91.1  < > 88.6 89.1 87.8 87.7 86.9  MCH 30.4  < > 31.3 30.6 30.2 30.6 30.5  MCHC 33.3  < > 35.3 34.4 34.4 34.9 35.0  RDW 13.6  < > 13.1 13.3 13.5 13.7 13.4  LYMPHSABS 7.9*  --   --   --   --   --   --   MONOABS 0.7  --   --   --   --   --   --   EOSABS 0.2  --   --   --   --   --   --   BASOSABS 0.1  --   --   --   --   --   --   < > = values in this interval not displayed.  Chemistries   Recent Labs Lab February 27, 2015 2020  02/27/15 2108 02/28/15 0034 02/28/15 0453 03/01/15 0327 03/02/15 0523  NA 136  < > 138 140 140 138 141  K 4.5  < > 3.9 3.7 3.6 3.3* 3.9  CL 102  < > 113* 113* 112* 112* 110  CO2 16*  < > 17* 20* 19* 22 24  GLUCOSE 319*  < > 104* 92 95 148* 118*  BUN 19  < > CREATININE 1.73*  < > 0.81 0.80 0.91 1.11 0.84  CALCIUM 8.2*  < > 8.3* 8.5* 8.4* 8.1* 8.2*  AST 70*  --   --   --  140* 115*  --   ALT 69*  --   --   --  94* 77*  --   ALKPHOS 102  --   --   --  68 63  --   BILITOT 0.8  --   --   --  1.0 0.7  --   < > = values  in this interval not displayed. ------------------------------------------------------------------------------------------------------------------ estimated creatinine clearance is 113.6 mL/min (by C-G formula based on Cr of 0.84). ------------------------------------------------------------------------------------------------------------------ No results for input(s): HGBA1C in the last 72 hours. ------------------------------------------------------------------------------------------------------------------  Recent Labs  02/28/15 1120 03/01/15 1234  TRIG 133 88   ------------------------------------------------------------------------------------------------------------------ No results for input(s): TSH, T4TOTAL, T3FREE, THYROIDAB in the last 72 hours.  Invalid input(s): FREET3 ------------------------------------------------------------------------------------------------------------------ No results for input(s): VITAMINB12,  FOLATE, FERRITIN, TIBC, IRON, RETICCTPCT in the last 72 hours.  Coagulation profile  Recent Labs Lab 02/27/15 0220 02/27/15 0743  INR 1.19 1.19    No results for input(s): DDIMER in the last 72 hours.  Cardiac Enzymes  Recent Labs Lab 02/27/15 0442 02/27/15 1122 02/27/15 1747  TROPONINI 32.94* 27.11* 14.90*   ------------------------------------------------------------------------------------------------------------------ Invalid input(s): POCBNP    Assessment & Plan   Principal Problem: 1.  Cardiac arrest (HCC) - secondary to drug overdose. Continue supportive care , Prognosis very poor. Likely severe anoxic brain injury based on the CT scan findings Appreciate neurology input, they have discussed the grave prognosis with patient's family. And strongly recommend patient's family withdrawal care. 2.  Drug overdose - tox screen positive for opiates and PCP.   3. Acute respiratory failure with hypoxia (HCC) - intubated, now has pneumonia  started on broad-spectrum antibiotics 4. Acute renal insufficiency resolved with IV fluid 5. Elevated troponin suspect due to cardiac arrest  appreciate cardiology input continue IV heparin Echo with systolic dysfunction 6. Seizure continue Keppra, deprivan 7. Code full family trying to decide regarding CODE STATUS    Code Status Orders        Start     Ordered   02/27/15 0201  Full code   Continuous     02/27/15 0200           Consults  pulmonary critical care, neurology   DVT ProphylaxisHeparin   Lab Results  Component Value Date   PLT 135* 03/02/2015     Time Spent in minutes 35 minutes of critical care time spent   Auburn Bilberry M.D on 03/02/2015 at 1:54 PM  Between 7am to 6pm - Pager - [phone]  After 6pm go to [email or web address] - password [email or web address] [facility]  [facility] [place] [facility]   Office  [phone]

## 2015-03-02 NOTE — Consult Note (Signed)
CC: cardiac arrest   HPI: BREK REECE is an 39 y.o. male s/p cardiac arrest with multiple agents on UTOX screen. S/p warming. CTH x 2 consistent with anoxic brain injury. S/p multiple seizures.    Past Medical History  Diagnosis Date  . Hepatitis C   . 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 Coronary artery disease   . Stroke (HCC)   . Depression     a. prior suicide attempts    Past Surgical History  Procedure Laterality Date  . Hernia repair      Family History  Problem Relation Age of Onset  . Dementia      Social History:  reports that he uses illicit drugs (IV and Heroin). He reports that he does not drink alcohol. His tobacco history is not on file.  No Known Allergies  Medications: I have reviewed the patient's current medications.  ROS: Unable obtain   Physical Examination: Blood pressure 109/59, pulse 93, temperature 100 F (37.8 C), temperature source Core (Comment), resp. rate 21, height  (1.778 m), weight 150 lb (68.04 kg), SpO2 97 %.  Sedated, not following commands Non reactive pupils 2mm, non reactive corneals No cough or gag No response to painful stimuli.    Laboratory Studies:   Basic Metabolic Panel:  Recent Labs Lab 02/27/15 2108 02/28/15 0034 02/28/15 0453 03/01/15 0327 03/02/15 0523  NA 138 140 140 138 141  K 3.9 3.7 3.6 3.3* 3.9  CL 113* 113* 112* 112* 110  CO2 17* 20* 19* 22 24  GLUCOSE 104* 92 95 148* 118*  BUN CREATININE 0.81 0.80 0.91 1.11 0.84  CALCIUM 8.3* 8.5* 8.4* 8.1* 8.2*    Liver Function Tests:  Recent Labs Lab 09-Mar-2015 2020 02/28/15 0453 03/01/15 0327  AST 70* 140* 115*  ALT 69* 94* 77*  ALKPHOS 102 68 63  BILITOT 0.8 1.0 0.7  PROT 6.6 6.3* 5.8*  ALBUMIN 3.8 3.5 3.1*   No results for input(s): LIPASE, AMYLASE in the last 168 hours. No results for input(s): AMMONIA in the last 168 hours.  CBC:  Recent Labs Lab 09-Mar-2015 2020  02/27/15 0442 02/28/15 0453 03/01/15 0327  03/01/15 2200 03/02/15 0523  WBC 24.2*  < > 21.0* 15.8* 18.8* 15.1* 14.6*  NEUTROABS 15.3*  --   --   --   --   --   --   HGB 15.3  < > 16.7 15.5 13.9 13.6 12.7*  HCT 45.8  < > 47.4 45.2 40.4 39.0* 36.3*  MCV 91.1  < > 88.6 89.1 87.8 87.7 86.9  PLT 232  < > 154 142* 194 147* 135*  < > = values in this interval not displayed.  Cardiac Enzymes:  Recent Labs Lab 2015-03-09 2020 02/27/15 0220 02/27/15 0442 02/27/15 1122 02/27/15 1747  TROPONINI 0.41* 31.27* 32.94* 27.11* 14.90*    BNP: Invalid input(s): POCBNP  CBG:  Recent Labs Lab 03/01/15 1234 03/01/15 1544 03/02/15 0033 03/02/15 0810 03/02/15 1151  GLUCAP 100* 101* 89 109* 105*    Microbiology: Results for orders placed or performed during the hospital encounter of March 09, 2015  C difficile quick scan w PCR reflex     Status: Abnormal   Collection Time: 02/27/15  2:26 AM  Result Value Ref Range Status   C Diff antigen POSITIVE (A) NEGATIVE Final   C Diff toxin NEGATIVE NEGATIVE Final   C Diff interpretation   Final  Positive for toxigenic C. difficile, active toxin production not detected. Patient has toxigenic C. difficile organisms present in the bowel, but toxin was not detected. The patient may be a carrier or the level of toxin in the sample was below the limit  of detection. This information should be used in conjunction with the patient's clinical history when deciding on possible therapy.   MRSA PCR Screening     Status: None   Collection Time: 02/27/15  2:26 AM  Result Value Ref Range Status   MRSA by PCR NEGATIVE NEGATIVE Final    Comment:        The GeneXpert MRSA Assay (FDA approved for NASAL specimens only), is one component of a comprehensive MRSA colonization surveillance program. It is not intended to diagnose MRSA infection nor to guide or monitor treatment for MRSA infections.   Clostridium Difficile by PCR     Status: Abnormal   Collection Time: 02/27/15  2:26 AM  Result Value Ref Range  Status   Toxigenic C Difficile by pcr POSITIVE (A) NEGATIVE Final    Comment: CRITICAL RESULT CALLED TO, READ BACK BY AND VERIFIED WITH: MONIQUE ROBERSON 02/27/15 0431 SJL     Coagulation Studies: No results for input(s): LABPROT, INR in the last 72 hours.  Urinalysis:  Recent Labs Lab March 08, 2015 2020  COLORURINE YELLOW*  LABSPEC 1.010  PHURINE 6.0  GLUCOSEU 150*  HGBUR 2+*  BILIRUBINUR NEGATIVE  KETONESUR NEGATIVE  PROTEINUR 100*  NITRITE NEGATIVE  LEUKOCYTESUR NEGATIVE    Lipid Panel:     Component Value Date/Time   TRIG 88 03/01/2015 1234    HgbA1C: No results found for: HGBA1C  Urine Drug Screen:     Component Value Date/Time   LABOPIA POSITIVE* 03-08-15 2020   LABBENZ NONE DETECTED 2015/03/08 2020   AMPHETMU NONE DETECTED 03-08-2015 2020   THCU NONE DETECTED 03/08/15 2020   LABBARB NONE DETECTED Mar 08, 2015 2020    Alcohol Level:  Recent Labs Lab March 08, 2015 2020  ETH <5    Other results: EKG: normal EKG, normal sinus rhythm, unchanged from previous tracings.  Imaging: Ct Head Wo Contrast  02/28/2015  CLINICAL DATA:  Heroin overdose with seizures. Possible anoxic brain injury. EXAM: CT HEAD WITHOUT CONTRAST TECHNIQUE: Contiguous axial images were obtained from the base of the skull through the vertex without intravenous contrast. COMPARISON:  Head CT March 08, 2015 and 12/09/2011. FINDINGS: There is progressive loss of gray-white differentiation throughout the cerebrum consistent with progressive diffuse cerebral edema. There is partial effacement of the subarachnoid spaces. No midline shift, hydrocephalus, extra-axial fluid collection or focal infarct demonstrated. The cerebellum remains fairly normal in appearance. The visualized paranasal sinuses, mastoid air cells and middle ears are clear. The calvarium is intact. IMPRESSION: Progressive generalized loss of gray-white differentiation throughout the cerebrum and effacement of the subarachnoid spaces  consistent with diffuse cerebral edema. Findings remain consistent with global anoxic injury. Electronically Signed   By: Carey Bullocks M.D.   On: 02/28/2015 14:55   Dg Chest Port 1 View  03/02/2015  CLINICAL DATA:  Respiratory failure, ventilatory support EXAM: PORTABLE CHEST 1 VIEW COMPARISON:  03/01/2015 FINDINGS: Endotracheal tube 5.1 cm above the carina. Right IJ central line tip lower SVC level. NG tube extends into the stomach with the tip not visualized. Exam is rotated to the left. Mild increased patchy bibasilar airspace process concerning for pneumonia or aspiration. No enlarging effusion or pneumothorax. Negative for edema. Normal heart size and vascularity. No abnormal osseous finding. IMPRESSION: Mild worsening bibasilar airspace  process concerning for pneumonia. Stable support apparatus. Electronically Signed   By: Judie PetitM.  Shick M.D.   On: 03/02/2015 10:13   Dg Chest Port 1 View  03/01/2015  CLINICAL DATA:  39 year old male with acute respiratory failure secondary to drug overdose EXAM: PORTABLE CHEST 1 VIEW COMPARISON:  Prior chest x-ray 02/28/2015 FINDINGS: The patient is intubated. The tip of the endotracheal tube is 4 cm above the carina. There is a right IJ approach central venous catheter the tip of which appears well positioned overlying the superior cavoatrial junction. A gastric tube is present. The proximal side hole overlies the gastric fundus. Cardiac and mediastinal contours are within normal limits. There may be minimal right basilar atelectasis. Otherwise, the lungs are clear. No acute osseous abnormality. IMPRESSION: 1. Stable and satisfactory support apparatus. 2. Perhaps mild right basilar atelectasis. Electronically Signed   By: Malachy MoanHeath  McCullough M.D.   On: 03/01/2015 09:43     Assessment/Plan:  39 y.o. male s/p cardiac arrest with multiple agents on UTOX screen. S/p warming. CTH x 2 consistent with anoxic brain injury. S/p multiple seizures.    - Keppra increased to  1500 tid  - s/p discussion with father. Clearly explained there is very poor prognosis for any recovery let alone meaningful. Pt will likely not come out of the hospital as has only lower medulla functioning due to overbreathing the vent which I suspect will get worse as edema increases.  - Strong consideration for DNR which was explained to father - rest of family should be coming in today - EEG if possible - if family has questions please call and will likely discuss further  Pauletta BrownsZEYLIKMAN, Ridhima Golberg   03/02/2015, 12:39 PM

## 2015-03-02 NOTE — Progress Notes (Signed)
ANTIBIOTIC CONSULT NOTE - INITIAL  Pharmacy Consult for vancomycin and Ceftazidime  Indication: pneumonia  No Known Allergies  Patient Measurements: Height: 5\' 10"  (177.8 cm) Weight: 150 lb (68.04 kg) IBW/kg (Calculated) : 73  Vital Signs: Temp: 99.9 F (37.7 C) (12/26 1100) Temp Source: Core (Comment) (12/26 0000) BP: 102/59 mmHg (12/26 1100) Pulse Rate: 95 (12/26 1100) Intake/Output from previous day: 12/25 0701 - 12/26 0700 In: 1353.5 [I.V.:822.9; NG/GT:310.7; IV Piggyback:220] Out: 1900 [Urine:1400; Emesis/NG output:500] Intake/Output from this shift: Total I/O In: 254 [I.V.:94; NG/GT:60; IV Piggyback:100] Out: -   Recent Labs  02/28/15 0453 03/01/15 0327 03/01/15 2200 03/02/15 0523  WBC 15.8* 18.8* 15.1* 14.6*  HGB 15.5 13.9 13.6 12.7*  PLT 142* 194 147* 135*  CREATININE 0.91 1.11  --  0.84   Estimated Creatinine Clearance: 113.6 mL/min (by C-G formula based on Cr of 0.84). No results for input(s): VANCOTROUGH, VANCOPEAK, VANCORANDOM, GENTTROUGH, GENTPEAK, GENTRANDOM, TOBRATROUGH, TOBRAPEAK, TOBRARND, AMIKACINPEAK, AMIKACINTROU, AMIKACIN in the last 72 hours.   Microbiology: Recent Results (from the past 720 hour(s))  C difficile quick scan w PCR reflex     Status: Abnormal   Collection Time: 02/27/15  2:26 AM  Result Value Ref Range Status   C Diff antigen POSITIVE (A) NEGATIVE Final   C Diff toxin NEGATIVE NEGATIVE Final   C Diff interpretation   Final    Positive for toxigenic C. difficile, active toxin production not detected. Patient has toxigenic C. difficile organisms present in the bowel, but toxin was not detected. The patient may be a carrier or the level of toxin in the sample was below the limit  of detection. This information should be used in conjunction with the patient's clinical history when deciding on possible therapy.   MRSA PCR Screening     Status: None   Collection Time: 02/27/15  2:26 AM  Result Value Ref Range Status   MRSA by PCR  NEGATIVE NEGATIVE Final    Comment:        The GeneXpert MRSA Assay (FDA approved for NASAL specimens only), is one component of a comprehensive MRSA colonization surveillance program. It is not intended to diagnose MRSA infection nor to guide or monitor treatment for MRSA infections.   Clostridium Difficile by PCR     Status: Abnormal   Collection Time: 02/27/15  2:26 AM  Result Value Ref Range Status   Toxigenic C Difficile by pcr POSITIVE (A) NEGATIVE Final    Comment: CRITICAL RESULT CALLED TO, READ BACK BY AND VERIFIED WITH: MONIQUE ROBERSON 02/27/15 0431 SJL    Medical History: Past Medical History  Diagnosis Date  . Hepatitis C   . 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 Coronary artery disease   . Stroke (HCC)   . Depression     a. prior suicide attempts   Assessment: 39 yo male with PMH of IV drug use, was admitted for drug overdose, cardiac arrest and seizures on 02/14/2015. Pharmacy consulted on 12/26 for dosing and monitoring of Vancomycin and Ceftazidime. Patients remain intubated   12/23: C. Diff Toxigenic PCR +             MRSA  Negative  12/26:  CXR- mild worsening bibasilar airspace process concerning pneumonia   Goal of Therapy:  Vancomycin trough level 15-20 mcg/ml  Plan:  Ke: 0.098   T1/2: 7.1   Vd: 48    Will start vancomycin 1250mg  IV q12 hours with 6 hour stack dosing.  Calculated trough at Css is 15.6. Will order trough level for 12/27 @ 1730, prior to 4th regimen dose.   Will start patient on ceftazidime 2g q8h based on recommendation for CrCl >69ml/min for  tx of RTI.   Pharmacy will continue to follow labs and renal function and make adjustments as needed.   Cher Nakai, PharmD Pharmacy Resident  03/02/2015,11:40 AM

## 2015-03-02 NOTE — Progress Notes (Signed)
PULMONARY / CRITICAL CARE MEDICINE   Name: Roy Gordon MRN: [id number] DOB: 08/04/1975    ADMISSION DATE:  2015/03/07 CONSULTATION DATE:  03/07/15  PT PROFILE:   39 yo M with hx of IVDA admitted after prolonged cardiac arrest with elevated troponin I (peak 32). Cerebral edema on initial CT head. Hypothermia protocol implemented.   MAJOR EVENTS/TEST RESULTS: 12/22 admitted after cardiac arrest. Hypothermia protocol. Troponin I elevated. Hypotension - norepinephrine initiated 12/22 CT head: Blurred cortex concerning for global anoxic injury 12/23 diarrhea. C diff PCR and toxin positive. Enteral vanc initiated. Norepinephrine weaned to off then resumed for recurrent hypotension 12/23 TTE: The estimated ejection fraction was in the range of 45% to 50%. Cannot exclude hypokinesis of the anteroseptal, anterior, and anterolateral myocardium. Doppler parameters are consistent with abnormal left ventricular relaxation (grade 1 diastolic dysfunction). 12/24 Rewarmed. NMBs discontinued. apparent seizure activity. Propofol initiated. Loaded with Keppra. Repeat Ct head and EEG ordered 12/24 CT head (repeat): Progression of cerebral edema 12/25 prolonged unresponsiveness after all sedation stopped. Apnea test performed. + spontaneous respiratory effort 12/25 detailed discussion with extended family. Poor prognosis conveyed. Recommended DNR if he arrests again. Established plan of EEG and Neuro consult 12/26 for prognostication with possible discussion of withdrawal after that 12/25 PM: recurrent seizures - propofol infusion resumed. Bloody OGT aspirate - heparin discontinued 12/26 Neurology consultation: Keppra dose increased. Prognosis for functional recovery deemed nil. This was communicated to family. Pt made DNR 12/26 Fever, new pulmonary infiltrates. Cultures obtained. Abx initiated  INDWELLING DEVICES:: ETT 12/22 >>  R IJ CVL 12/22 >>  L femoral A-line 12/22 >>   MICRO DATA: MRSA PCR  12/22 >> NEG C diff PCR 12/23 >> POS Blood 12/26 >>  Resp 12/26 >>   ANTIMICROBIALS:  Vanc (enteral) 12/23 >>  IV Vanc 12/26 >>  Ceftaz 12/26 >>   SUBJECTIVE:  RASS -5. No spontaneous movement   VITAL SIGNS: BP 109/59 mmHg  Pulse 93  Temp(Src) 100 F (37.8 C) (Core (Comment))  Resp 21  Ht  (1.778 m)  Wt 150 lb (68.04 kg)  BMI 21.52 kg/m2  SpO2 97%  HEMODYNAMICS: CVP:  [5 mmHg-18 mmHg] 6 mmHg  VENTILATOR SETTINGS: Vent Mode:  [-] PRVC FiO2 (%):  [30 %] 30 % Set Rate:  [14 bmp] 14 bmp Vt Set:  [450 mL] 450 mL PEEP:  [5 cmH20] 5 cmH20  INTAKE / OUTPUT: I/O last 3 completed shifts: In: 3363.3 [I.V.:2722.6; NG/GT:310.7; IV Piggyback:330] Out: 2550 [Urine:1850; Emesis/NG output:700]  PHYSICAL EXAMINATION:  Gen: RASS -5, no spont movement, intubated, sedated HEENT: NCAT, WNL Neck: NO LAN, no JVD noted Lungs: full BS, no adventitious sounds Cardiovascular: Reg, no M noted Abdomen: Soft, NT +BS Ext: no C/C/E Neuro: pupils pinpoint, no spont movement, no withdrawal from pain Skin: No lesions noted   LABS:  BMET  Recent Labs Lab 02/28/15 0453 03/01/15 0327 03/02/15 0523  NA 140 138 141  K 3.6 3.3* 3.9  CL 112* 112* 110  CO2 19* 22 24  BUN CREATININE 0.91 1.11 0.84  GLUCOSE 95 148* 118*    Electrolytes  Recent Labs Lab 02/28/15 0453 03/01/15 0327 03/02/15 0523  CALCIUM 8.4* 8.1* 8.2*    CBC  Recent Labs Lab 03/01/15 0327 03/01/15 2200 03/02/15 0523  WBC 18.8* 15.1* 14.6*  HGB 13.9 13.6 12.7*  HCT 40.4 39.0* 36.3*  PLT 194 147* 135*    Coag's  Recent Labs Lab 02/27/15 0220 02/27/15 0743  APTT  32 75*  INR 1.19 1.19    Sepsis Markers  Recent Labs Lab 02/15/2015 2020 02/27/15 0226  LATICACIDVEN 9.0* 1.4    ABG  Recent Labs Lab 02/28/15 1020 03/01/15 0500 03/01/15 1056  PHART 7.39 7.43 7.29*  PCO2ART 30* 30* 47  PO2ART 135* 110* 123*    Liver Enzymes  Recent Labs Lab 02/27/2015 2020 02/28/15 0453  03/01/15 0327  AST 70* 140* 115*  ALT 69* 94* 77*  ALKPHOS 102 68 63  BILITOT 0.8 1.0 0.7  ALBUMIN 3.8 3.5 3.1*    Cardiac Enzymes  Recent Labs Lab 02/27/15 0442 02/27/15 1122 02/27/15 1747  TROPONINI 32.94* 27.11* 14.90*    Glucose  Recent Labs Lab 03/01/15 0720 03/01/15 1234 03/01/15 1544 03/02/15 0033 03/02/15 0810 03/02/15 1151  GLUCAP 113* 100* 101* 89 109* 105*    Imaging Dg Chest Port 1 View  03/02/2015  CLINICAL DATA:  Respiratory failure, ventilatory support EXAM: PORTABLE CHEST 1 VIEW COMPARISON:  03/01/2015 FINDINGS: Endotracheal tube 5.1 cm above the carina. Right IJ central line tip lower SVC level. NG tube extends into the stomach with the tip not visualized. Exam is rotated to the left. Mild increased patchy bibasilar airspace process concerning for pneumonia or aspiration. No enlarging effusion or pneumothorax. Negative for edema. Normal heart size and vascularity. No abnormal osseous finding. IMPRESSION: Mild worsening bibasilar airspace process concerning for pneumonia. Stable support apparatus. Electronically Signed   By: Judie PetitM.  Shick M.D.   On: 03/02/2015 10:13     ASSESSMENT / PLAN:  PULMONARY A: VDRF post arrest P:   Cont full vent support - settings reviewed and/or adjusted Cont vent bundle Daily SBT if/when meets criteria  CARDIOVASCULAR A:  Cardiac arrest Acute MI Hypotension P:  MAP goal now 65 mmHg DNR Would not resume vasopressors  RENAL A:   AKI, Cr improving Oliguria, resolved P:   Monitor BMET intermittently Monitor I/Os Correct electrolytes as indicated Not a candidate for HD  GASTROINTESTINAL A:   No issues P:   SUP: IV PPI Resume TFs 12/26  HEMATOLOGIC A:   Mild thrombocytopenia, recurrent P:  DVT px: SCDs Monitor CBC intermittently Transfuse per usual guidelines  INFECTIOUS A:   C diff colitis HCAP P:   Monitor temp, WBC count Micro and abx as above  ENDOCRINE A:   No issues P:   Change  CBGs to q 8 hrs  NEUROLOGIC A:   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 Dose adjusted 12/25    FAMILY  Father updated @ bedside. DNR established. Likely withdraw of vent 12/27  CCM time: 40 mins The above time includes time spent in consultation with patient and/or family members and reviewing care plan on multidisciplinary rounds  Billy Fischeravid Simonds, MD PCCM service Mobile [phone] Pager [phone]  03/02/2015, 12:20 PM

## 2015-03-02 NOTE — Progress Notes (Signed)
eLink Physician-Brief Progress Note Patient Name: Myriam ForehandBryan P Halteman DOB: 1976-02-14 MRN: [id number]   Date of Service  03/02/2015  HPI/Events of Note  Temp dropped now to 96.3  eICU Interventions  Order for warming blanket     Intervention Category Intermediate Interventions: Other:  Alfonso Shackett 03/02/2015, 5:14 AM

## 2015-03-02 NOTE — Progress Notes (Signed)
   03/01/15 1240  Clinical Encounter Type  Visited With Patient;Health care provider;Other (Comment)  Visit Type Follow-up  Consult/Referral To Chaplain  Spiritual Encounters  Spiritual Needs Ritual  Stress Factors  Patient Stress Factors Not reviewed  Chaplain and nurses observed the priest performing last rites. Spoke with priest and thanked him for his service. Was still unable to locate family and inform them of ritual being performed. Chaplain Bari Leib A. Lolly Glaus Ext. [phone]

## 2015-03-03 ENCOUNTER — Inpatient Hospital Stay: Payer: Medicaid Other

## 2015-03-03 LAB — BASIC METABOLIC PANEL
Anion gap: 5 (ref 5–15)
BUN: 10 mg/dL (ref 6–20)
CALCIUM: 8 mg/dL — AB (ref 8.9–10.3)
CO2: 26 mmol/L (ref 22–32)
CREATININE: 0.73 mg/dL (ref 0.61–1.24)
Chloride: 106 mmol/L (ref 101–111)
GFR calc non Af Amer: >60 mL/min (ref >60)
Glucose, Bld: 120 mg/dL — ABNORMAL HIGH (ref 65–99)
Potassium: 3.5 mmol/L (ref 3.5–5.1)
Sodium: 137 mmol/L (ref 135–145)

## 2015-03-03 LAB — CBC
HCT: 35.1 % — ABNORMAL LOW (ref 40.0–52.0)
Hemoglobin: 12.2 g/dL — ABNORMAL LOW (ref 13.0–18.0)
MCH: 30.1 pg (ref 26.0–34.0)
MCHC: 34.7 g/dL (ref 32.0–36.0)
MCV: 86.5 fL (ref 80.0–100.0)
PLATELETS: 132 10*3/uL — AB (ref 150–440)
RBC: 4.06 MIL/uL — AB (ref 4.40–5.90)
RDW: 13.6 % (ref 11.5–14.5)
WBC: 9.4 10*3/uL (ref 3.8–10.6)

## 2015-03-03 LAB — GLUCOSE, CAPILLARY
GLUCOSE-CAPILLARY: 145 mg/dL — AB (ref 65–99)
Glucose-Capillary: 127 mg/dL — ABNORMAL HIGH (ref 65–99)

## 2015-03-03 MED ORDER — LORAZEPAM 2 MG/ML IJ SOLN: 2.0000 mg | INTRAMUSCULAR | Status: DC | PRN

## 2015-03-03 MED ORDER — MORPHINE SULFATE (PF) 4 MG/ML IV SOLN
INTRAVENOUS | Status: AC
  Administered 2015-03-03: 4 mg
  Filled 2015-03-03: qty 1

## 2015-03-03 MED ORDER — MORPHINE SULFATE (PF) 2 MG/ML IV SOLN: 2.0000 mg | INTRAVENOUS | Status: DC | PRN

## 2015-03-03 MED ORDER — MORPHINE SULFATE (PF) 2 MG/ML IV SOLN
2.0000 mg | INTRAVENOUS | Status: DC | PRN
  Filled 2015-03-03: qty 2

## 2015-03-03 MED ORDER — MORPHINE BOLUS VIA INFUSION: 5.0000 mg | INTRAVENOUS | Status: DC | PRN

## 2015-03-03 MED FILL — Medication: Qty: 1 | Status: AC

## 2015-03-04 LAB — CULTURE, RESPIRATORY W GRAM STAIN

## 2015-03-04 LAB — CULTURE, RESPIRATORY

## 2015-03-07 LAB — CULTURE, BLOOD (ROUTINE X 2)
CULTURE: NO GROWTH
CULTURE: NO GROWTH

## 2015-03-08 NOTE — Progress Notes (Signed)
Pt made comfort care. Pt extubated by RT. Pt family at bedside.  Pt expired at 10:07am, Idalie Canto Smith Gregory, RN prImagene Richesonounced TOD per order, confirmed by Charline BillsStaci Collie, RN Dr. Elpidio AnisSudini, Nursing supervisor, and CDS notified.

## 2015-03-08 NOTE — Progress Notes (Signed)
Clinical Social Worker (CSW) discussed case with Dr. Sung AmabileSimonds and there are no social work needs at this time. Patient has been made comfort care. Please reconsult if future social work needs arise. CSW signing off.   Jetta LoutBailey Morgan, LCSWA [phone]

## 2015-03-08 NOTE — Progress Notes (Signed)
Patient made comfort care this am, RT in to extubate patient per Dr. Sung AmabileSimonds.  Patient suctioned prior to extubation and extubated to 2LPM Felicity.  Family in room throughout procedure.

## 2015-03-08 NOTE — Progress Notes (Signed)
PULMONARY / CRITICAL CARE MEDICINE   Name: Roy Gordon MRN: [id number] DOB: 07-07-1975    ADMISSION DATE:  Mar 06, 2015 CONSULTATION DATE:  03/06/15  PT PROFILE:   40 yo M with hx of IVDA admitted after prolonged cardiac arrest with elevated troponin I (peak 32). Cerebral edema on initial CT head. Hypothermia protocol implemented.   MAJOR EVENTS/TEST RESULTS: 12/22 admitted after cardiac arrest. Hypothermia protocol. Troponin I elevated. Hypotension - norepinephrine initiated 12/22 CT head: Blurred cortex concerning for global anoxic injury 12/23 diarrhea. C diff PCR and toxin positive. Enteral vanc initiated. Norepinephrine weaned to off then resumed for recurrent hypotension 12/23 TTE: The estimated ejection fraction was in the range of 45% to 50%. Cannot exclude hypokinesis of the anteroseptal, anterior, and anterolateral myocardium. Doppler parameters are consistent with abnormal left ventricular relaxation (grade 1 diastolic dysfunction). 12/24 Rewarmed. NMBs discontinued. apparent seizure activity. Propofol initiated. Loaded with Keppra. Repeat Ct head and EEG ordered 12/24 CT head (repeat): Progression of cerebral edema 12/25 prolonged unresponsiveness after all sedation stopped. Apnea test performed. + spontaneous respiratory effort 12/25 detailed discussion with extended family. Poor prognosis conveyed. Recommended DNR if he arrests again. Established plan of EEG and Neuro consult 12/26 for prognostication with possible discussion of withdrawal after that 12/25 PM: recurrent seizures - propofol infusion resumed. Bloody OGT aspirate - heparin discontinued 12/26 Neurology consultation: Keppra dose increased. Prognosis for functional recovery deemed nil. This was communicated to family. Pt made DNR 12/26 Fever, new pulmonary infiltrates. Cultures obtained. Abx initiated 12/26 EEG: minimal brain activity 12/27 Family updated. Poor prognosis again discussed. They are all in  agreement with discontinuation of ventilator and full comfort care. "Withdrawal.." protocol ordered  INDWELLING DEVICES:: ETT 12/22 >>  R IJ CVL 12/22 >>  L femoral A-line 12/22 >>   MICRO DATA: MRSA PCR 12/22 >> NEG C diff PCR 12/23 >> POS Blood 12/26 >>  Resp 12/26 >>   ANTIMICROBIALS:  Vanc (enteral) 12/23 >>  IV Vanc 12/26 >>  Ceftaz 12/26 >>   SUBJECTIVE:  RASS -5. No spontaneous movement   VITAL SIGNS: BP 131/82 mmHg  Pulse 100  Temp(Src) 99.3 F (37.4 C) (Other (Comment))  Resp 22  Ht  (1.778 m)  Wt 150 lb (68.04 kg)  BMI 21.52 kg/m2  SpO2 96%  HEMODYNAMICS: CVP:  [6 mmHg-10 mmHg] 8 mmHg  VENTILATOR SETTINGS: Vent Mode:  [-] PRVC FiO2 (%):  [30 %] 30 % Set Rate:  [14 bmp] 14 bmp Vt Set:  [450 mL] 450 mL PEEP:  [5 cmH20] 5 cmH20 Plateau Pressure:  [16 cmH20] 16 cmH20  INTAKE / OUTPUT: I/O last 3 completed shifts: In: 2520.4 [I.V.:625.4; NG/GT:670; IV Piggyback:1225] Out: 1650 [Urine:1450; Emesis/NG output:200]  PHYSICAL EXAMINATION:  Gen: RASS -5, no spont movement, intubated, sedated HEENT: NCAT, WNL Neck: NO LAN, no JVD noted Lungs: full BS, no adventitious sounds Cardiovascular: Reg, no M noted Abdomen: Soft, NT +BS Ext: no C/C/E Neuro: pupils pinpoint, no spont movement, no withdrawal from pain Skin: No lesions noted   LABS:  BMET  Recent Labs Lab 03/01/15 0327 03/02/15 0523 02/13/2015 0629  NA 138 141 137  K 3.3* 3.9 3.5  CL 112* 110 106  CO2 BUN CREATININE 1.11 0.84 0.73  GLUCOSE 148* 118* 120*    Electrolytes  Recent Labs Lab 03/01/15 0327 03/02/15 0523 02/05/2015 0629  CALCIUM 8.1* 8.2* 8.0*    CBC  Recent Labs Lab 03/01/15 2200 03/02/15 0523 03/06/2015  0629  WBC 15.1* 14.6* 9.4  HGB 13.6 12.7* 12.2*  HCT 39.0* 36.3* 35.1*  PLT 147* 135* 132*    Coag's  Recent Labs Lab 02/27/15 0220 02/27/15 0743  APTT 32 75*  INR 1.19 1.19    Sepsis Markers  Recent Labs Lab  09-27-2014 2020 02/27/15 0226  LATICACIDVEN 9.0* 1.4    ABG  Recent Labs Lab 02/28/15 1020 03/01/15 0500 03/01/15 1056  PHART 7.39 7.43 7.29*  PCO2ART 30* 30* 47  PO2ART 135* 110* 123*    Liver Enzymes  Recent Labs Lab 09-27-2014 2020 02/28/15 0453 03/01/15 0327  AST 70* 140* 115*  ALT 69* 94* 77*  ALKPHOS 102 68 63  BILITOT 0.8 1.0 0.7  ALBUMIN 3.8 3.5 3.1*    Cardiac Enzymes  Recent Labs Lab 02/27/15 0442 02/27/15 1122 02/27/15 1747  TROPONINI 32.94* 27.11* 14.90*    Glucose  Recent Labs Lab 03/02/15 0810 03/02/15 1151 03/02/15 1628 03/02/15 2005 03/05/2015 0006 02/17/2015 0736  GLUCAP 109* 105* 118* 129* 145* 127*    Imaging Dg Chest Port 1 View  02/22/2015  CLINICAL DATA:  Hypoxia EXAM: PORTABLE CHEST 1 VIEW COMPARISON:  March 02, 2015 FINDINGS: Endotracheal tube tip is 2.3 cm above the carina. Nasogastric tube tip and side port are below the diaphragm. Central catheter tip is just proximal to the cavoatrial junction. No pneumothorax. There is persistent patchy airspace disease in the left base. There has been partial clearing of airspace opacity from medial right base. No new opacity. Heart size and pulmonary vascularity normal. No adenopathy. IMPRESSION: Tube and catheter positions as described without pneumothorax. Persistent patchy infiltrate left lower lobe. Partial but incomplete clearing of infiltrate medial right base. No new opacity. No change in cardiac silhouette. Electronically Signed   By: Bretta BangWilliam  Woodruff III M.D.   On: 02/22/2015 07:33     ASSESSMENT / PLAN:  PULMONARY A: VDRF post arrest P:   Cont full vent support - settings reviewed and/or adjusted Cont vent bundle Daily SBT if/when meets criteria  CARDIOVASCULAR A:  Cardiac arrest Acute MI Hypotension P:  MAP goal now 65 mmHg DNR Would not resume vasopressors  RENAL A:   AKI, Cr improving Oliguria, resolved P:   Monitor BMET intermittently Monitor I/Os Correct  electrolytes as indicated Not a candidate for HD  GASTROINTESTINAL A:   No issues P:   SUP: IV PPI Resume TFs 12/26  HEMATOLOGIC A:   Mild thrombocytopenia, recurrent P:  DVT px: SCDs Monitor CBC intermittently Transfuse per usual guidelines  INFECTIOUS A:   C diff colitis HCAP P:   Monitor temp, WBC count Micro and abx as above  ENDOCRINE A:   No issues P:   Change CBGs to q 8 hrs  NEUROLOGIC A:   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 Dose adjusted 12/25    FAMILY  Father updated @ bedside.   CCM time: 30 mins The above time includes time spent in consultation with patient and/or family members and reviewing care plan on multidisciplinary rounds  Billy Fischeravid Simonds, MD PCCM service Mobile [phone] Pager [phone]  02/09/2015, 9:11 AM

## 2015-03-08 NOTE — Progress Notes (Signed)
   03/13/15 0700  Clinical Encounter Type  Visited With Patient and family together  Visit Type Spiritual support  Referral From Nurse;Chaplain  Consult/Referral To Chaplain  Spiritual Encounters  Spiritual Needs Ritual;Prayer;[facility] text;Emotional;Grief support  Stress Factors  Patient Stress Factors Exhausted  Family Stress Factors Family relationships;Major life changes  Met w/patient & family to provide ministration to the dying (Last Rites).  Chap. Damond Borchers G. [place]

## 2015-03-08 NOTE — Progress Notes (Signed)
   12-27-14 1000  Clinical Encounter Type  Visited With Family;Patient and family together;Health care provider  Visit Type Follow-up;Spiritual support;Death;Patient actively dying;Other (Comment)  Referral From Nurse  Consult/Referral To Chaplain  Spiritual Encounters  Spiritual Needs Emotional;Grief support  Stress Factors  Patient Stress Factors Not reviewed  Family Stress Factors Loss;Loss of control;Major life changes  Chaplain rounded in the unit and was updated by the nurse of the family's decision to extubate patient. Offered a compassionate presence, support and care as patient was extubated and died. Provided bereavement care and support as they processed their grief. Chaplain Jaylynn Siefert A. [place] Ext. [phone]

## 2015-03-08 DEATH — deceased

## 2015-03-12 DIAGNOSIS — G936 Cerebral edema: Secondary | ICD-10-CM | POA: Insufficient documentation

## 2015-04-08 NOTE — Procedures (Addendum)
ELECTROENCEPHALOGRAM REPORT   Patient: Roy ForehandBryan P Lamprecht       Room #: ICU  Age: 40 y.o.        Sex: male Referring Physician: Sung AmabileSimonds Report Date:  03/12/2015        Interpreting Physician: Thana FarrEYNOLDS, Dail Lerew  History: Roy Gordon is an 40 y.o. male s/p cardiac arrest with periods of myoclonus  Medications:  Propofol, Keppra  Conditions of Recording:  This is a 16 channel EEG carried out with the patient in the intubated and sedated state.  Description:  The background activity is severely attenuated and consists of a diffusely distributed low voltage alpha and beta activity.  This activity is continuous.  No epileptiform activity is noted.  No clinical myoclonic activity is noted during the recording.   Hyperventilation and intermittent photic stimulation were not performed  IMPRESSION: This electroencephalogram consists of diffuse low voltage beta and alpha activity that is consistent with a medication effect and/or artifact.  No epileptiform activity is noted.  No clinical myoclonic activity is noted.     Thana FarrLeslie Caeleigh Prohaska, MD [facility] [phone] 03/12/2015, 12:13 PM

## 2015-04-08 NOTE — Discharge Summary (Signed)
[facility] [place] [facility]    DEATH NOTE  PATIENT NAME: Roy Gordon    MR#:  [id number]  DATE OF BIRTH:  January 22, 1976  DATE OF ADMISSION:  01/01/15  PRIMARY CARE PHYSICIAN: No primary care provider on file.   Pronounced dead  10:07 AM on 02/27/2015.   CAUSE OF DEATH - Heroin overdose  ADMISSION DIAGNOSIS:  Cardiac arrest (HCC) [I46.9] Unresponsive [R40.4]  HISTORY OF PRESENT ILLNESS ON ADMISSION   Roy Gordon is a 40 y.o. male who presents with heart attack arrest. Patient is a known IV heroin user and had a cardiac event witnessed by his girlfriend gas station earlier today. He was coded by EMS brought in the ED stabilized intubated after ROSC. Girlfriend stated that he may using heroin earlier today. His tox screen is positive for opiates as well as PCP. Hospitalists were called for intubation status post cardiac arrest with acute respiratory failure with ICU consult for hypothermic protocol.   HOSPITAL COURSE:  1.  Cardiac arrest (HCC) - secondary to drug overdose. 2.  Drug overdose - tox screen positive for opiates and PCP.  3. Acute respiratory failure with hypoxia (HCC) 4. Acute renal failure 5. Elevated troponin 6. Status epilepticus 7. Bilateral pneumonia 8. Septic shock  Please refer to detailed H&P and progress note.  Patient was brought to the emergency room due to cardiac arrest from drug overdose. Patient was intubated and started on hypothermia protocol as per guidelines. Was followed by critical care in the ICU. Started on antibiotics and pressors for septic shock and pneumonia. IV fluids. Also seen by neurology and EEG showing seizures and placed on Keppra. CT scan done. Patient had severe anoxic brain injury with no improvement. Dr. Darrol AngelSimons of critical care discussed at length with the family along with the neurologist and due to poor prognosis and no improvement patient was made DO NOT RESUSCITATE and terminally  extubated. Patient was pronounced dead at 10:07 AM on 02/10/2015.   Roy Gordon, Roy Gordon on 03/09/2015 at 5:15 AM  [facility] [place] [facility]    OFFICE [phone]  Total clinical and documentation time for today Under 30 minutes

## 2016-02-29 IMAGING — CR DG CHEST 1V
1 series · 2 of 2 positions shown · non-contrast
Comparison: Yesterday

CLINICAL DATA: Central line placement

EXAM:
CHEST 1 VIEW

[Series 1: ap · 0.17mm/px · 2 of 2 slices shown]
[im 1/2]
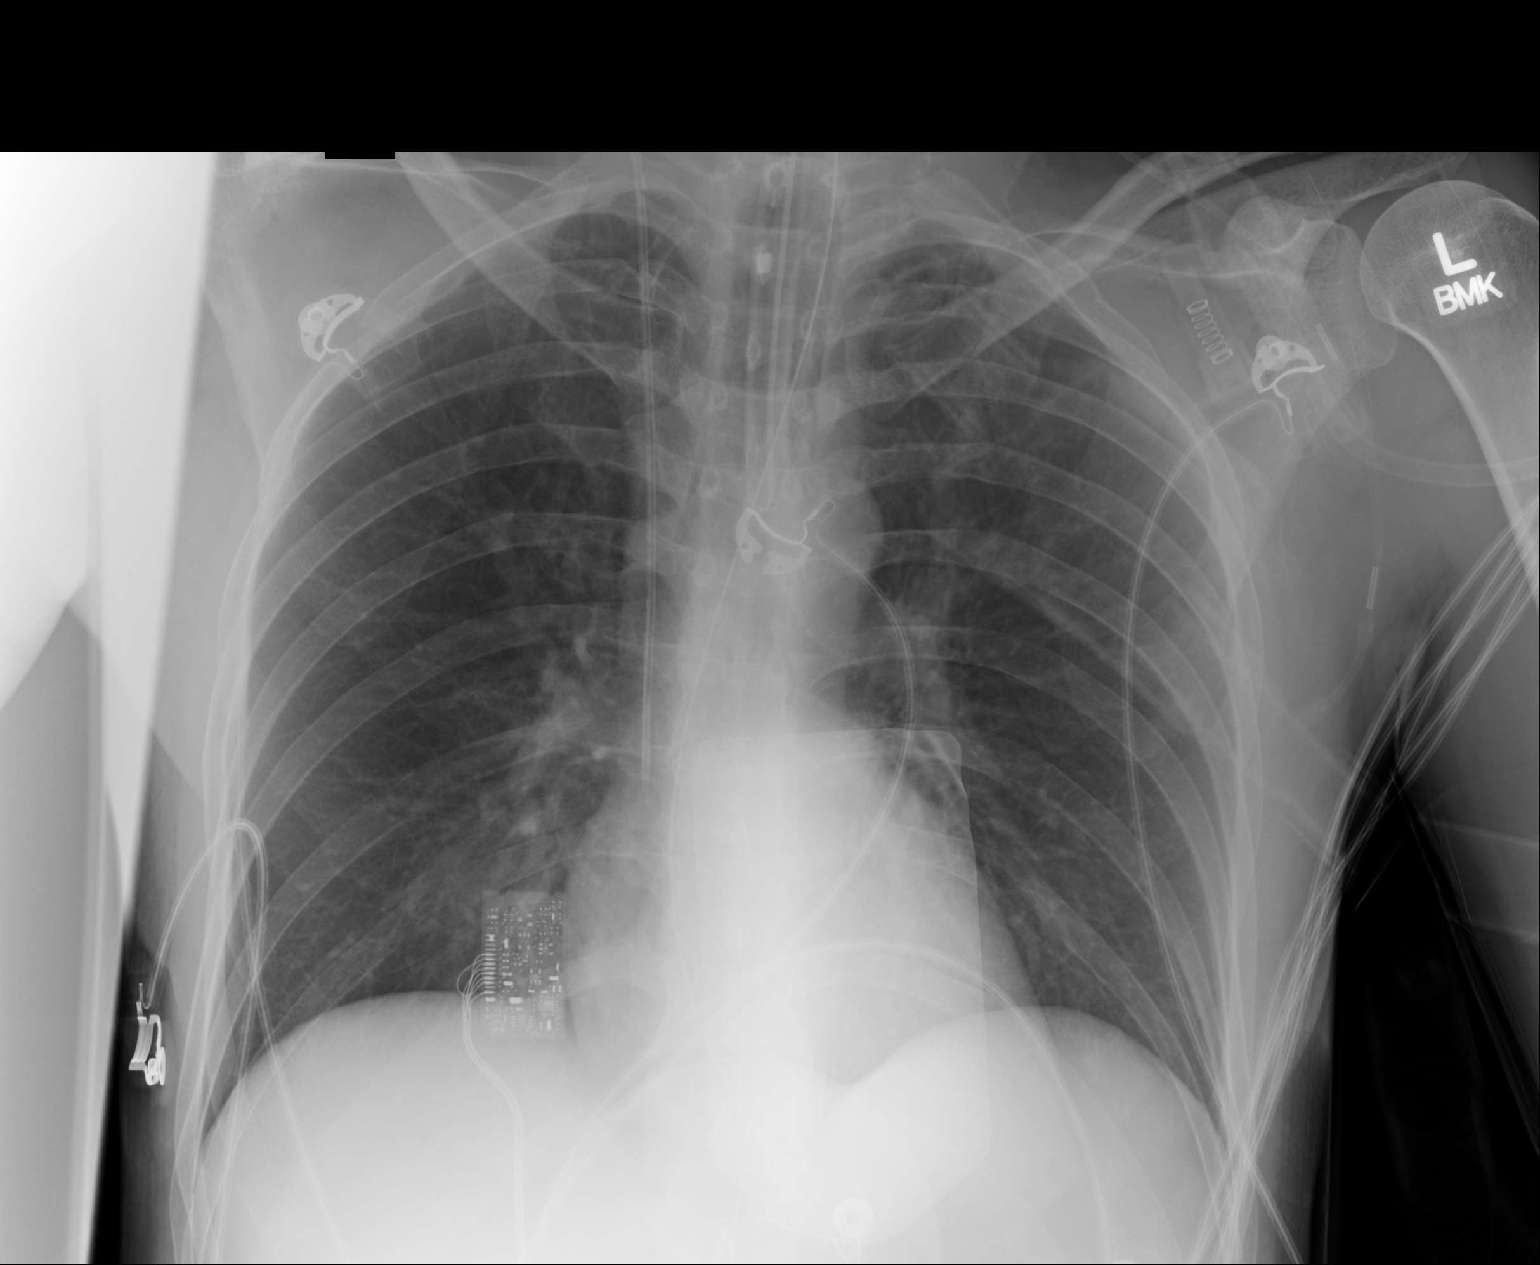
[im 2/2]
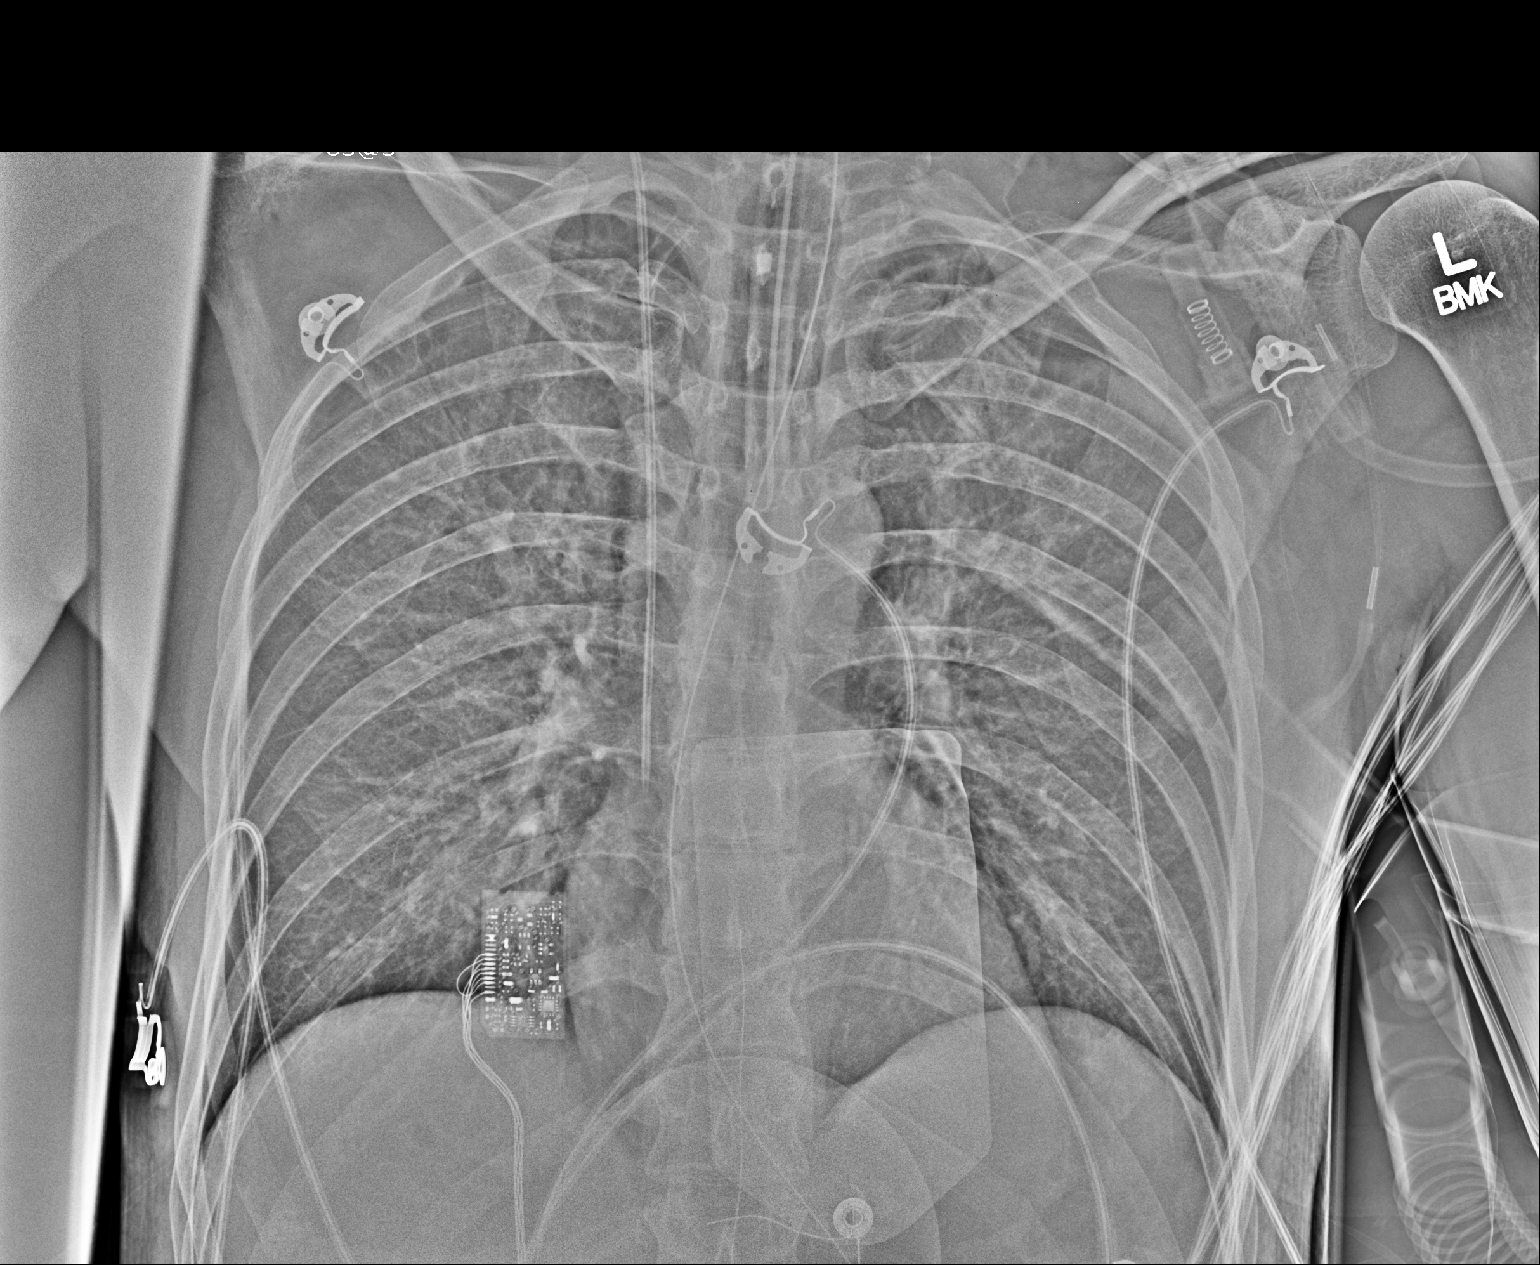

[2 of 2 positions shown; findings below may reference images not displayed]

FINDINGS: New right IJ line, tip at the upper cavoatrial junction. There is no
pneumothorax or new mediastinal widening. Unchanged positioning of
endotracheal and orogastric tubes.

There is no edema, consolidation, effusion, or pneumothorax. Normal
heart size and mediastinal contours.
IMPRESSION: 1. New right IJ line without complicating feature.
2. Lungs remain clear.
